# Patient Record
Sex: Male | Born: 1945 | Race: Black or African American | Hispanic: No | Smoking: Current every day smoker
Health system: Southern US, Community
[De-identification: ages and names within clinical notes are randomized; demographics above are authoritative.]

## PROBLEM LIST (undated history)

## (undated) DIAGNOSIS — F17200 Nicotine dependence, unspecified, uncomplicated: Secondary | ICD-10-CM

## (undated) DIAGNOSIS — M1990 Unspecified osteoarthritis, unspecified site: Secondary | ICD-10-CM

## (undated) DIAGNOSIS — I2510 Atherosclerotic heart disease of native coronary artery without angina pectoris: Secondary | ICD-10-CM

## (undated) DIAGNOSIS — R39198 Other difficulties with micturition: Secondary | ICD-10-CM

## (undated) DIAGNOSIS — J4489 Other specified chronic obstructive pulmonary disease: Secondary | ICD-10-CM

## (undated) DIAGNOSIS — R079 Chest pain, unspecified: Secondary | ICD-10-CM

## (undated) DIAGNOSIS — D126 Benign neoplasm of colon, unspecified: Secondary | ICD-10-CM

## (undated) DIAGNOSIS — F1010 Alcohol abuse, uncomplicated: Secondary | ICD-10-CM

## (undated) DIAGNOSIS — J449 Chronic obstructive pulmonary disease, unspecified: Secondary | ICD-10-CM

## (undated) DIAGNOSIS — R011 Cardiac murmur, unspecified: Secondary | ICD-10-CM

## (undated) DIAGNOSIS — G250 Essential tremor: Secondary | ICD-10-CM

## (undated) DIAGNOSIS — J9383 Other pneumothorax: Secondary | ICD-10-CM

## (undated) DIAGNOSIS — N183 Chronic kidney disease, stage 3 (moderate): Secondary | ICD-10-CM

## (undated) DIAGNOSIS — K219 Gastro-esophageal reflux disease without esophagitis: Secondary | ICD-10-CM

## (undated) DIAGNOSIS — I10 Essential (primary) hypertension: Secondary | ICD-10-CM

## (undated) HISTORY — DX: Chest pain, unspecified: R07.9

## (undated) HISTORY — DX: Unspecified osteoarthritis, unspecified site: M19.90

## (undated) HISTORY — DX: Essential (primary) hypertension: I10

## (undated) HISTORY — DX: Essential tremor: G25.0

## (undated) HISTORY — PX: COLONOSCOPY: SHX174

## (undated) HISTORY — DX: Nicotine dependence, unspecified, uncomplicated: F17.200

## (undated) HISTORY — DX: Benign neoplasm of colon, unspecified: D12.6

---

## 2000-04-03 ENCOUNTER — Encounter: Payer: Self-pay | Admitting: Emergency Medicine

## 2000-04-03 ENCOUNTER — Emergency Department (HOSPITAL_COMMUNITY): Admission: EM | Admit: 2000-04-03 | Discharge: 2000-04-03 | Payer: Self-pay | Admitting: Emergency Medicine

## 2003-05-19 DIAGNOSIS — D126 Benign neoplasm of colon, unspecified: Secondary | ICD-10-CM

## 2003-05-19 HISTORY — DX: Benign neoplasm of colon, unspecified: D12.6

## 2004-01-31 ENCOUNTER — Ambulatory Visit (HOSPITAL_COMMUNITY): Admission: RE | Admit: 2004-01-31 | Discharge: 2004-01-31 | Payer: Self-pay | Admitting: *Deleted

## 2004-01-31 ENCOUNTER — Encounter (INDEPENDENT_AMBULATORY_CARE_PROVIDER_SITE_OTHER): Payer: Self-pay | Admitting: *Deleted

## 2009-08-05 ENCOUNTER — Ambulatory Visit (HOSPITAL_COMMUNITY): Admission: RE | Admit: 2009-08-05 | Discharge: 2009-08-05 | Payer: Self-pay | Admitting: Interventional Radiology

## 2009-08-05 ENCOUNTER — Inpatient Hospital Stay (HOSPITAL_COMMUNITY): Admission: EM | Admit: 2009-08-05 | Discharge: 2009-08-08 | Payer: Self-pay | Admitting: Emergency Medicine

## 2009-08-05 DIAGNOSIS — J9383 Other pneumothorax: Secondary | ICD-10-CM

## 2009-08-05 HISTORY — PX: CHEST TUBE INSERTION: SHX231

## 2009-08-05 HISTORY — DX: Other pneumothorax: J93.83

## 2009-08-08 ENCOUNTER — Ambulatory Visit: Payer: Self-pay | Admitting: Cardiothoracic Surgery

## 2009-08-14 ENCOUNTER — Ambulatory Visit: Payer: Self-pay | Admitting: Cardiothoracic Surgery

## 2009-08-22 ENCOUNTER — Ambulatory Visit: Payer: Self-pay | Admitting: Cardiothoracic Surgery

## 2009-08-22 ENCOUNTER — Encounter: Admission: RE | Admit: 2009-08-22 | Discharge: 2009-08-22 | Payer: Self-pay | Admitting: Cardiothoracic Surgery

## 2009-09-13 ENCOUNTER — Ambulatory Visit: Payer: Self-pay | Admitting: Cardiothoracic Surgery

## 2010-08-11 LAB — COMPREHENSIVE METABOLIC PANEL
AST: 17 U/L (ref 0–37)
Calcium: 8.9 mg/dL (ref 8.4–10.5)
Creatinine, Ser: 0.83 mg/dL (ref 0.4–1.5)
GFR calc Af Amer: >60 mL/min (ref >60)
GFR calc non Af Amer: >60 mL/min (ref >60)
Glucose, Bld: 97 mg/dL (ref 70–99)
Sodium: 139 mEq/L (ref 135–145)
Total Bilirubin: 0.4 mg/dL (ref 0.3–1.2)

## 2010-08-11 LAB — PROTIME-INR
INR: 1.06 (ref 0.00–1.49)
Prothrombin Time: 13.7 seconds (ref 11.6–15.2)

## 2010-08-11 LAB — CBC
Hemoglobin: 14.7 g/dL (ref 13.0–17.0)
MCV: 100.3 fL — ABNORMAL HIGH (ref 78.0–100.0)
RBC: 4.37 MIL/uL (ref 4.22–5.81)
WBC: 5.8 10*3/uL (ref 4.0–10.5)

## 2010-08-11 LAB — DIFFERENTIAL
Basophils Relative: 1 % (ref 0–1)
Eosinophils Absolute: 1.1 10*3/uL — ABNORMAL HIGH (ref 0.0–0.7)
Eosinophils Relative: 19 % — ABNORMAL HIGH (ref 0–5)
Monocytes Relative: 10 % (ref 3–12)
Neutrophils Relative %: 36 % — ABNORMAL LOW (ref 43–77)

## 2010-08-11 LAB — APTT: aPTT: 33 seconds (ref 24–37)

## 2010-09-30 NOTE — Assessment & Plan Note (Signed)
OFFICE VISIT   Michael Day, Michael Day  DOB:  [birth year]                                        August 22, 2009  CHART #:  [id number]   The patient presented to the emergency room on August 05, 2009, with left  spontaneous pneumothorax that had probably been present for at least a  week prior according to his symptoms.  A left chest tube was placed with  reexpansion of the lung and the patient was ultimately discharged home  doing well.  At this point he comes to the office today asking if he can  be put on long-term disability which would not think spontaneous left  pneumothorax would result in need for long-term disability.  He also in  spite of counseling while in the hospital continues to smoke some.   On exam his blood pressure 135/90, pulse 85, respiratory rate 18, O2  saturations 97% on room air.  His lungs are clear.  The chest tube sites  healed without infection.  He has no wheezing.  He has no pedal edema or  calf tenderness.   Followup chest x-ray shows clear lung fields with reexpansion of the  lung without evidence of pneumothorax or pneumomediastinum.   It was again reviewed with the patient the need to stop smoking.  He was  referred to his primary care doctor, Dr. Janace Litten for consideration of  nicotine patch and/or other assist in his not smoking endeavor.  He is  now aware of the signs and symptoms of pneumothorax and he knows to call  should he have any recurrent symptoms.   Sheliah Plane, MD  Electronically Signed   EG/MEDQ  D:  08/22/2009  T:  08/23/2009  Job:  [id number]   cc:   Bradd Burner, PA

## 2010-10-03 NOTE — Op Note (Signed)
NAMEMIRANDA, GARBER                           ACCOUNT NO.:  [id number]   MEDICAL RECORD NO.:  [id number]                   PATIENT TYPE:  AMB   LOCATION:  ENDO                                 FACILITY:  [facility]   PHYSICIAN:  Althea Grimmer. Luther Parody, M.D.            DATE OF BIRTH:  1945-09-19   DATE OF PROCEDURE:  01/31/2004  DATE OF DISCHARGE:                                 OPERATIVE REPORT   PROCEDURE:  Video colonoscopy.   INDICATIONS FOR PROCEDURE:  Screening in a 65 year old male without GI  symptoms.   PREPARATION:  N.p.o. since midnight having taken Phospho-Soda prep and a  clear liquid diet, the mucosa is clean throughout up to insertion of the  cecum.   SEDATION:  He received 50 mg Demerol and 7 mg Versed intravenously.  In  addition, he was on nasal cannula O2.   DESCRIPTION OF PROCEDURE:  The Olympus video colonoscope was inserted via  the rectum and advanced through a very tortuous sigmoid colon filled with  diverticula to the splenic flexure.  From this point on, intubation to the  cecum was technically much easier.  Cecal landmarks were identified and  photographed.  In image number one is seen sigmoid diverticulosis.  In image  number two is extensive right sided diverticulosis. In image number three is  seen the cecum with diverticulosis.  On withdrawal, the mucosa was carefully  evaluated.  Diverticula were seen throughout the colon.  In image number  four is a diminutive polyp at 70 cm that was biopsied and removed.  Two  other diminutive polyps were seen at 35 cm and 25 cm and also biopsied.  Retroflex view of the rectum was normal.   IMPRESSION:  1.  Three left sided diminutive polyps biopsied.  2.  Extensive pandiverticulosis.   The patient tolerated the procedure well.  Pulse, blood pressure, and  oximetry testing were stable throughout.  He will be observed in the  recovery room for one hour and discharged if alert.   PLAN:  Polyp histology will be reviewed  and the patient advised of  appropriate follow up.  He is given a brochure on diverticulosis.                                               Althea Grimmer. Luther Parody, M.D.    PJS/MEDQ  D:  01/31/2004  T:  01/31/2004  Job:  [id number]   cc:   Stacie Acres. White, M.D.  [address]  [place]  [place] [address]  Fax: [phone]

## 2011-03-13 ENCOUNTER — Inpatient Hospital Stay (INDEPENDENT_AMBULATORY_CARE_PROVIDER_SITE_OTHER)
Admission: RE | Admit: 2011-03-13 | Discharge: 2011-03-13 | Disposition: A | Discharge status: Home / Self Care | Payer: Self-pay | Source: Ambulatory Visit | Attending: Family Medicine | Admitting: Family Medicine

## 2011-03-13 DIAGNOSIS — I10 Essential (primary) hypertension: Secondary | ICD-10-CM

## 2011-03-13 LAB — POCT I-STAT, CHEM 8
Calcium, Ion: 1.26 mmol/L (ref 1.12–1.32)
Chloride: 108 mEq/L (ref 96–112)
Creatinine, Ser: 1.1 mg/dL (ref 0.50–1.35)
Glucose, Bld: 105 mg/dL — ABNORMAL HIGH (ref 70–99)
Potassium: 4 mEq/L (ref 3.5–5.1)
Sodium: 144 mEq/L (ref 135–145)
TCO2: 24 mmol/L (ref 0–100)

## 2011-06-24 ENCOUNTER — Encounter: Payer: Self-pay | Admitting: Internal Medicine

## 2011-06-24 ENCOUNTER — Ambulatory Visit (INDEPENDENT_AMBULATORY_CARE_PROVIDER_SITE_OTHER): Payer: Self-pay | Admitting: Internal Medicine

## 2011-06-24 DIAGNOSIS — J9383 Other pneumothorax: Secondary | ICD-10-CM | POA: Insufficient documentation

## 2011-06-24 DIAGNOSIS — M1990 Unspecified osteoarthritis, unspecified site: Secondary | ICD-10-CM | POA: Insufficient documentation

## 2011-06-24 DIAGNOSIS — I10 Essential (primary) hypertension: Secondary | ICD-10-CM

## 2011-06-24 DIAGNOSIS — D126 Benign neoplasm of colon, unspecified: Secondary | ICD-10-CM

## 2011-06-24 DIAGNOSIS — Z8709 Personal history of other diseases of the respiratory system: Secondary | ICD-10-CM

## 2011-06-24 DIAGNOSIS — K5730 Diverticulosis of large intestine without perforation or abscess without bleeding: Secondary | ICD-10-CM | POA: Insufficient documentation

## 2011-06-24 NOTE — Progress Notes (Signed)
  Subjective:    Patient ID: Michael Day, male    DOB: 03-29-1946, 66 y.o.   MRN: [id number]  HPI Michael Day is a pleasant 66 year old gentleman with past history of diverticulosis and adenomatous polyps of colon, hypertension and arthritis who comes the clinic for establishment as a new patient.  He was followed with [facility] physicians as his primary care but he does not have insurance and so wants to followup in our clinic and get orange card.  He was seen by Dr. Charlott Rakes for a colonoscopy about 4-5 years before and he needs repeat colonoscopy as per patient.  He says that he takes lisinopril and triamterene for his high blood-pressure- but does remember the dose. We'll request records from his previous PCP.  Overall he denies any fever, chills, nausea vomiting, chest pain, short of breath, vomiting, diarrhea, constipation.  He has history of left pneumothorax with lung collapse in 2011 which required chest tube placement- but is been stable and has had none new episodes after that.   Review of Systems    as per history of present illness, all other systems reviewed and negative. Objective:   Physical Exam General: NAD HEENT: PERRL, EOMI, no scleral icterus Cardiac: S1, S2, RRR, no rubs, murmurs or gallops Pulm: clear to auscultation bilaterally, moving normal volumes of air Abd: soft, nontender, nondistended, BS present Ext: warm and well perfused, no pedal edema Neuro: alert and oriented X3, cranial nerves II-XII grossly intact        Assessment & Plan:

## 2011-06-24 NOTE — Assessment & Plan Note (Signed)
Will refer for colonoscopy today- but could not be set up until he gets orange card. He is going to apply for his orange card today.

## 2011-06-24 NOTE — Patient Instructions (Signed)
Please make a followup appointment in 4-6 weeks after the colonoscopy.  Take all your medications as you take.  If you need any refills or early appointment call the clinic.

## 2011-06-24 NOTE — Assessment & Plan Note (Signed)
Lab Results  Component Value Date   NA 144 03/13/2011   K 4.0 03/13/2011   CL 108 03/13/2011   CO2 24 08/05/2009   BUN 21 03/13/2011   CREATININE 1.10 03/13/2011    BP Readings from Last 3 Encounters:  06/24/11 130/82    Assessment: Hypertension control:  controlled  Progress toward goals:  at goal Barriers to meeting goals:  no barriers identified  Plan: Hypertension treatment:  continue current medications. I will wait for the records from his last PCP- as he does not remember exact dosing of his medications.

## 2011-07-29 ENCOUNTER — Encounter: Payer: Self-pay | Admitting: Internal Medicine

## 2011-07-29 DIAGNOSIS — F17200 Nicotine dependence, unspecified, uncomplicated: Secondary | ICD-10-CM | POA: Insufficient documentation

## 2011-07-29 DIAGNOSIS — G250 Essential tremor: Secondary | ICD-10-CM | POA: Insufficient documentation

## 2011-07-29 DIAGNOSIS — F1010 Alcohol abuse, uncomplicated: Secondary | ICD-10-CM | POA: Insufficient documentation

## 2011-08-19 ENCOUNTER — Encounter (HOSPITAL_COMMUNITY): Payer: Self-pay | Admitting: Gastroenterology

## 2011-08-19 ENCOUNTER — Ambulatory Visit (HOSPITAL_COMMUNITY)
Admission: RE | Admit: 2011-08-19 | Discharge: 2011-08-19 | Disposition: A | Discharge status: Home / Self Care | Payer: Self-pay | Source: Ambulatory Visit | Attending: Gastroenterology | Admitting: Gastroenterology

## 2011-08-19 ENCOUNTER — Encounter (HOSPITAL_COMMUNITY)
Admission: RE | Disposition: A | Discharge status: Home / Self Care | Payer: Self-pay | Source: Ambulatory Visit | Attending: Gastroenterology

## 2011-08-19 DIAGNOSIS — I10 Essential (primary) hypertension: Secondary | ICD-10-CM | POA: Insufficient documentation

## 2011-08-19 DIAGNOSIS — Z860100 Personal history of colon polyps, unspecified: Secondary | ICD-10-CM | POA: Insufficient documentation

## 2011-08-19 DIAGNOSIS — Z79899 Other long term (current) drug therapy: Secondary | ICD-10-CM | POA: Insufficient documentation

## 2011-08-19 DIAGNOSIS — D126 Benign neoplasm of colon, unspecified: Secondary | ICD-10-CM | POA: Insufficient documentation

## 2011-08-19 DIAGNOSIS — K648 Other hemorrhoids: Secondary | ICD-10-CM | POA: Insufficient documentation

## 2011-08-19 DIAGNOSIS — K5730 Diverticulosis of large intestine without perforation or abscess without bleeding: Secondary | ICD-10-CM | POA: Insufficient documentation

## 2011-08-19 HISTORY — PX: COLONOSCOPY: SHX5424

## 2011-08-19 SURGERY — COLONOSCOPY
Anesthesia: Moderate Sedation

## 2011-08-19 MED ORDER — FENTANYL NICU IV SYRINGE 50 MCG/ML
INJECTION | INTRAMUSCULAR | Status: DC | PRN
  Administered 2011-08-19: 25 ug via INTRAVENOUS
  Administered 2011-08-19: 15 ug via INTRAVENOUS
  Administered 2011-08-19: 10 ug via INTRAVENOUS
  Administered 2011-08-19: 25 ug via INTRAVENOUS

## 2011-08-19 MED ORDER — MIDAZOLAM HCL 10 MG/2ML IJ SOLN
INTRAMUSCULAR | Status: AC
  Filled 2011-08-19: qty 2

## 2011-08-19 MED ORDER — MIDAZOLAM HCL 5 MG/5ML IJ SOLN
INTRAMUSCULAR | Status: DC | PRN
  Administered 2011-08-19: 1 mg via INTRAVENOUS
  Administered 2011-08-19 (×3): 2 mg via INTRAVENOUS

## 2011-08-19 MED ORDER — FENTANYL CITRATE 0.05 MG/ML IJ SOLN
INTRAMUSCULAR | Status: AC
  Filled 2011-08-19: qty 2

## 2011-08-19 NOTE — Brief Op Note (Signed)
See endopro note 

## 2011-08-19 NOTE — Discharge Instructions (Signed)
Will call results of polyp when available. Hold aspirin and ibuprofen products for 3 days.

## 2011-08-19 NOTE — Op Note (Signed)
[facility] [address] [place], [place]  [address]  COLONOSCOPY PROCEDURE REPORT  PATIENT:  Michael, Day  MR#:  [id number] BIRTHDATE:  05-17-1946, 65 yrs. old  GENDER:  male ENDOSCOPIST:  Charlott Rakes, MD REF. BY: PROCEDURE DATE:  08/19/2011 PROCEDURE:  Colonoscopy with biopsy ASA CLASS:  Class II INDICATIONS:  history of pre-cancerous (adenomatous) colon polyps  MEDICATIONS:   Fentanyl 75 mcg IV, Versed 7 mg IV  DESCRIPTION OF PROCEDURE:   After the risks benefits and alternatives of the procedure were thoroughly explained, informed consent was obtained.  The Pentax Ped Colon [id number] endoscope was introduced through the anus and advanced to the cecum, which was identified by both the appendix and ileocecal valve, without limitations.  The quality of the prep was good..  The instrument was then slowly withdrawn as the colon was fully examined. <<PROCEDUREIMAGES>>  FINDINGS:  Rectal exam unremarkable.  Pediatric colonoscope inserted into the colon and advanced to the cecum, where the appendiceal orifice and ileocecal valve were identified.  On careful withdrawal of the colonoscope diffuse diverticulosis was noted. A 2 mm semi-sessile polyp was seen in the sigmoid colon that was removed with cold biopsy forceps.    Retroflexion revealed small internal hemorrhoids.  COMPLICATIONS:  None  IMPRESSION:   1. Small colon polyp -s/p removal with cold biopsy 2. Diffuse diverticulosis 3. Small internal hemorrhoids  RECOMMENDATIONS:    1. High fiber diet 2. F/U on path 3. Avoid aspirin products for 3 days  ______________________________ Charlott Rakes, MD  CC:  n. eSIGNEDCharlott Rakes at 08/19/2011 11:02 AM  Asher Muir, [id number]

## 2011-08-19 NOTE — H&P (Signed)
  Date of Initial H&P: 07/28/11  History reviewed, patient examined, no change in status, stable for surgery.

## 2011-08-20 ENCOUNTER — Encounter (HOSPITAL_COMMUNITY): Payer: Self-pay

## 2011-08-20 ENCOUNTER — Encounter (HOSPITAL_COMMUNITY): Payer: Self-pay | Admitting: Gastroenterology

## 2011-08-21 ENCOUNTER — Telehealth: Payer: Self-pay | Admitting: *Deleted

## 2011-08-21 NOTE — Telephone Encounter (Signed)
Pt states he had collapsed lung on left "a couple of years ago" - it is recovered now but "every now and then for the last couple of months, when I move a certain way, like tucking my shirt into my pants on the left side, it feels like something is pressing on my heart and it hurts." - pt denies shortness of breath or any other discomfort - requests cardiology referral - pt advised to come for ov to see physician to determine if need is cardiac or something else. Pt given appt for Monday, April 8 with Dr. Dierdre Searles, the first available appt in the clinic, but instructed to report to ED if chest discomfort, pressure, pain, increases in frequency, intensity, or is accompanied by any breathing difficulty. Pt verbalizes understanding of these instructions, for appt, and for emergent care at ED if needed. Dorie Rank, RN, 08/21/2011, 5:56P

## 2011-08-24 ENCOUNTER — Ambulatory Visit (INDEPENDENT_AMBULATORY_CARE_PROVIDER_SITE_OTHER): Payer: Self-pay | Admitting: Internal Medicine

## 2011-08-24 ENCOUNTER — Encounter: Payer: Self-pay | Admitting: Internal Medicine

## 2011-08-24 VITALS — BP 148/91 | HR 79 | Temp 97.6°F | Ht 69.0 in | Wt 189.9 lb

## 2011-08-24 DIAGNOSIS — N62 Hypertrophy of breast: Secondary | ICD-10-CM | POA: Insufficient documentation

## 2011-08-24 DIAGNOSIS — I10 Essential (primary) hypertension: Secondary | ICD-10-CM

## 2011-08-24 DIAGNOSIS — M940 Chondrocostal junction syndrome [Tietze]: Secondary | ICD-10-CM | POA: Insufficient documentation

## 2011-08-24 MED ORDER — HYDROCHLOROTHIAZIDE 25 MG PO TABS: 25.0000 mg | ORAL_TABLET | Freq: Every day | ORAL | Status: DC

## 2011-08-24 MED ORDER — IBUPROFEN 400 MG PO TABS: 600.0000 mg | ORAL_TABLET | Freq: Four times a day (QID) | ORAL | Status: AC | PRN

## 2011-08-24 NOTE — Patient Instructions (Signed)
1. Take ibuprofen for chest wall pain 2. F/u in 2 weeks about discuss about the smoking and alcohol cessation

## 2011-08-24 NOTE — Progress Notes (Addendum)
Patient ID: Jalen Daluz, male   DOB: 1945-07-25, 66 y.o.   MRN: [id number]  Subjective:   Patient ID: Kaelan Amble male   DOB: 23-Jul-1945 66 y.o.   MRN: [id number]  HPI: Mr. Montminy is a pleasant 66 year old gentleman with PMH of hypertension,  history of left pneumothorax with lung collapse in 2011, alcohol and tobacco abuse who comes the clinic for evaluation of left chest pain and left breast enlargement.  1. Patient states that he has had intermittent left chest wall pain for past 3 months. His left chest wall pain is around his left breast, sharp, lasting seconds, without radiation, and could only be caused by turning his upper body. Patient reports he has had a history of left pneumothorax with lung collapse in 2011 which required chest tube placement- but has been stable and has had none new episodes after that. Patient states that his chest wall pain is located near his healed chest tube site as well. Denies redness, swelling, skin rash. Denies any chest pressure, palpitation or SOB.   2. Patient also noticed his left breast getting bigger for last two weeks. He denies any B symptoms. Denies any nipple discharge.  Patent denies any family history of breast cancer.  No headache, fever, or sore throat. No shortness of breath or dyspnea on exertion. No nausea, vomiting, or abdominal pain. No melena, diarrhea or incontinence. No muscle weakness.Denies depression. No appetite or weight changes.   Of note, patient admits ~40 years history of heavy alcohol and Tobacco abuse.   Past Medical History  Diagnosis Date  . Hypertension   . Arthritis   . Adenomatous polyp of colon 2005    3 polyps removal per colonoscopy  . Tobacco dependence   . Tremor, essential   . Alcohol use    Current Outpatient Prescriptions  Medication Sig Dispense Refill  . fluticasone (FLONASE) 50 MCG/ACT nasal spray Place 2 sprays into the nose daily.      . hydrochlorothiazide (HYDRODIURIL) 25 MG tablet Take 1  tablet (25 mg total) by mouth daily.  30 tablet  0  . lisinopril (PRINIVIL,ZESTRIL) 40 MG tablet Take 40 mg by mouth daily.      Marland Kitchen omeprazole (PRILOSEC) 20 MG capsule Take 20 mg by mouth daily.      Marland Kitchen DISCONTD: hydrochlorothiazide (HYDRODIURIL) 25 MG tablet Take 25 mg by mouth daily.      Marland Kitchen ibuprofen (ADVIL,MOTRIN) 400 MG tablet Take 1.5 tablets (600 mg total) by mouth every 6 (six) hours as needed for pain.  30 tablet  0   Family History  Problem Relation Age of Onset  . Stroke Mother   . Prostate cancer Father    History   Social History  . Marital Status: Divorced    Spouse Name: N/A    Number of Children: N/A  . Years of Education: N/A   Social History Main Topics  . Smoking status: Current Everyday Smoker -- 0.5 packs/day    Types: Cigarettes  . Smokeless tobacco: None  . Alcohol Use: 2.5 - 3.0 oz/week    5-6 drink(s) per week     couple shots/ day  . Drug Use: No  . Sexually Active: None   Other Topics Concern  . None   Social History Narrative   lives in [place].   Review of Systems: See HPI Objective:  Physical Exam: Filed Vitals:   08/24/11 1403  BP: 148/91  Pulse: 79  Temp: 97.6 F (36.4 C)  TempSrc: Oral  Height:  5\' 9"  (1.753 m)  Weight: 189 lb 14.4 oz (86.138 kg)  SpO2: 100%   General: NAD HEENT: PERRL, EOMI, no scleral icterus Cardiac: S1, S2, RRR, no rubs, murmurs or gallops Pulm: clear to auscultation bilaterally, moving normal volumes of air B/L breasts enlargement noted with Left breast is bigger than right. Left lateral chest wall tenderness noted. No crepitus.  No LAN, No nipple discharge. No erythema, swelling, or skin changes noted on breasts. No Bruising noted.   Abd: soft, nontender, nondistended, BS present Ext: warm and well perfused, no pedal edema Neuro: alert and oriented X3, cranial nerves II-XII grossly intact  Assessment & Plan:

## 2011-08-24 NOTE — Assessment & Plan Note (Signed)
Patient presents with B/L breast enlargement in the setting of long Hx of heavy alcohol abuse. He reports recent enlargement right side more than left side.  No LAN, nipple discharge or skin change. Patient refused mammogram for now.  - will follow up in 2 weeks - consider mammogram

## 2011-08-24 NOTE — Assessment & Plan Note (Signed)
Patient is noted to have mild elevated BP during this visit. He states that he does not take his Maxzide due to financial strain  - will stop his Maxzide  - add HCTZ

## 2011-08-25 LAB — BASIC METABOLIC PANEL WITH GFR
BUN: 26 mg/dL — ABNORMAL HIGH (ref 6–23)
CO2: 24 mEq/L (ref 19–32)
Chloride: 104 mEq/L (ref 96–112)
Creat: 1.35 mg/dL (ref 0.50–1.35)
GFR, Est Non African American: 55 mL/min — ABNORMAL LOW
Potassium: 4.2 mEq/L (ref 3.5–5.3)

## 2011-09-11 ENCOUNTER — Encounter: Payer: Self-pay | Admitting: Internal Medicine

## 2011-09-11 ENCOUNTER — Ambulatory Visit (INDEPENDENT_AMBULATORY_CARE_PROVIDER_SITE_OTHER): Payer: Self-pay | Admitting: Internal Medicine

## 2011-09-11 VITALS — BP 138/89 | HR 78 | Temp 97.4°F | Ht 69.0 in | Wt 188.8 lb

## 2011-09-11 DIAGNOSIS — G250 Essential tremor: Secondary | ICD-10-CM

## 2011-09-11 DIAGNOSIS — G252 Other specified forms of tremor: Secondary | ICD-10-CM

## 2011-09-11 DIAGNOSIS — I10 Essential (primary) hypertension: Secondary | ICD-10-CM

## 2011-09-11 DIAGNOSIS — D126 Benign neoplasm of colon, unspecified: Secondary | ICD-10-CM

## 2011-09-11 MED ORDER — PROPRANOLOL HCL 40 MG PO TABS: 40.0000 mg | ORAL_TABLET | Freq: Two times a day (BID) | ORAL | Status: DC

## 2011-09-11 NOTE — Assessment & Plan Note (Signed)
Lab Results  Component Value Date   NA 140 08/24/2011   K 4.2 08/24/2011   CL 104 08/24/2011   CO2 24 08/24/2011   BUN 26* 08/24/2011   CREATININE 1.35 08/24/2011   CREATININE 1.10 03/13/2011    BP Readings from Last 3 Encounters:  09/11/11 138/89  08/24/11 148/91  08/19/11 135/86    Assessment: Hypertension control:  controlled  Progress toward goals:  at goal Barriers to meeting goals:  no barriers identified  Plan: Hypertension treatment:  continue current medications

## 2011-09-11 NOTE — Patient Instructions (Signed)
Please make followup appointment in 4-5 months. Make an early appointment meanwhile if you still having chest pain (as you had during last visit ) or any other problems.  Start taking propranolol 40 mg twice a day for your hand shaking. It should take the edge off. If the shaking gets worse give [place] a call to make an early appointment.  Take all medications regularly.

## 2011-09-11 NOTE — Progress Notes (Signed)
  Subjective:    Patient ID: Michael Day, male    DOB: 15-Dec-1945, 66 y.o.   MRN: [id number]  HPI [name] is a pleasant 66 year old man with past history significant for adenomatous polyp in colon, hypertension, alcohol use, essential tremor who comes the clinic for followup visit. He was seen by Dr. Dierdre Searles on 08/24/2011 for left chest muscular skeletal pain- which is better now. He only has pain if he strains his chest muscles more. No pain at rest. No tenderness present.  He does complain of essential tremor which he has for many years now. It's getting worse now and he has trouble writing, holding a cup or any intentional work lately. He is never been treated for this before. He denies any sensation changes in hands or lower extremities. Has no focal weakness or numbness. Has no change in gait or trouble walking. Says that alcohol takes off the edge once in a while. Nothing else makes it better. Intentional activity makes it worse. Has no change in vision, headache, chest pain, short of breath, fever, nausea vomiting.   Review of Systems    as per history of present illness, all other systems reviewed and negative. Objective:   Physical Exam General: NAD HEENT: PERRL, EOMI, no scleral icterus Cardiac: RRR, no rubs, murmurs or gallops Pulm: clear to auscultation bilaterally, moving normal volumes of air Abd: soft, nontender, nondistended, BS present Ext: warm and well perfused, no pedal edema Neuro: alert and oriented X3, cranial nerves II-XII grossly intact, intentional tremor present, and handwriting haphazard       Assessment & Plan:

## 2011-09-11 NOTE — Assessment & Plan Note (Signed)
Recent colonoscopy per Dr. Charlott Rakes, shows extensive diverticulosis and remote one small adenomatous polyp. Repeat colonoscopy in 5 years.

## 2011-09-11 NOTE — Assessment & Plan Note (Signed)
Patient's essential tremor has worsened lately.  I will try to control symptomatically- bearing with propranolol 40 mg twice a day- as first line agent and see how he responds. I will see him back in 3-4 months and reassess.

## 2011-10-06 ENCOUNTER — Other Ambulatory Visit: Payer: Self-pay | Admitting: *Deleted

## 2011-10-06 MED ORDER — HYDROCHLOROTHIAZIDE 25 MG PO TABS: 25.0000 mg | ORAL_TABLET | Freq: Every day | ORAL | Status: DC

## 2011-11-16 ENCOUNTER — Other Ambulatory Visit: Payer: Self-pay | Admitting: *Deleted

## 2011-11-17 ENCOUNTER — Telehealth: Payer: Self-pay | Admitting: *Deleted

## 2011-11-17 MED ORDER — HYDROCHLOROTHIAZIDE 25 MG PO TABS: 25.0000 mg | ORAL_TABLET | Freq: Every day | ORAL | Status: DC

## 2011-11-17 NOTE — Telephone Encounter (Signed)
Pt called stating a contractor is coming out to take his door out because it contains lead.   Pt feels fine but wanted to know if he needs to be examined or have his blood checked. Pt # [id number]

## 2011-11-18 NOTE — Telephone Encounter (Signed)
Unclear without evaluating patient. Might need lab tests including CBC and lead levels with an office visit, if there is a concern of Lead exposure( acute or chronic). Please call patient and ask if she can come for evaluation. Thanks.  Baruch Lewers.

## 2011-11-27 NOTE — Telephone Encounter (Signed)
Pt scheduled for 7/17th at 3:45

## 2011-12-02 ENCOUNTER — Encounter: Payer: Self-pay | Admitting: Internal Medicine

## 2011-12-02 ENCOUNTER — Ambulatory Visit (INDEPENDENT_AMBULATORY_CARE_PROVIDER_SITE_OTHER): Payer: Self-pay | Admitting: Internal Medicine

## 2011-12-02 VITALS — BP 122/76 | HR 72 | Temp 97.4°F | Ht 69.0 in | Wt 187.3 lb

## 2011-12-02 DIAGNOSIS — G250 Essential tremor: Secondary | ICD-10-CM

## 2011-12-02 DIAGNOSIS — F17200 Nicotine dependence, unspecified, uncomplicated: Secondary | ICD-10-CM

## 2011-12-02 DIAGNOSIS — I10 Essential (primary) hypertension: Secondary | ICD-10-CM

## 2011-12-02 NOTE — Progress Notes (Signed)
  Subjective:    Patient ID: Michael Day, male    DOB: 01/04/1946, 66 y.o.   MRN: [id number]  HPI Michael Day Is a pleasant 66 year old man with past history of essential tremor, costochondritis, hypertension, osteoarthritis, colon polyp who comes to the clinic for followup visit. His house was checked recently and was found Lead on the back door and garage and he was concerned about lead poisoning. He does not have any anemia, chronic abdominal pain, lead line, New neuropsychiatric changes. Discussed with him in details about above things. He has chronic essential tremors- familial, which is unchanged for past many years. He denies any fever, chills, nausea, vomiting, chest pain, short of breath. He does have intermittent chest muscle pain and back muscle pain when he works out more. Otherwise he feels at baseline.  - He does complain of morning cough and nighttime coughing- which gets better after sputum. He is a chronic smoker. About 50-pack-year history. Discussed about quitting smoking in detail. Hasn't tried it before. [place] Spaniel he would think about it.    Review of Systems    As per history of present illness, all other systems reviewed and negative. Objective:   Physical Exam  General: NAD HEENT: PERRL, EOMI, no scleral icterus, no lead line. Cardiac: RRR, no rubs, murmurs or gallops Pulm: clear to auscultation bilaterally, moving normal volumes of air Abd: soft, nontender, nondistended, BS present Ext: warm and well perfused, no pedal edema Neuro: alert and oriented X3, cranial nerves II-XII grossly intact       Assessment & Plan:

## 2011-12-02 NOTE — Patient Instructions (Signed)
Please make followup appointment in 5-6 months. Meanwhile keep taking all medications regularly. If anything comes up, gives a call for early appointment. You don't seem to have any signs or symptoms of lead poisoning. Don't think any need for blood test at present.

## 2011-12-02 NOTE — Assessment & Plan Note (Signed)
Counseled for smoking cessation in detail. Patient does not seem ready to quit at present. But was inquiring about his options for nicotine patches and gums. Patches will be limited due to insurance issue. Advised him to try nicotine gums if he can afford. Talk with him about deciding a quit date- and then let me know for further help. We can also try bupropion or Chantix in future as needed.

## 2011-12-02 NOTE — Assessment & Plan Note (Signed)
Blood pressure at goal. Continue lisinopril, HCTZ and propranolol. Propranolol is also for essential tremors.

## 2011-12-02 NOTE — Assessment & Plan Note (Signed)
Some improvement with propranolol. Continue for now.

## 2012-02-11 ENCOUNTER — Other Ambulatory Visit: Payer: Self-pay | Admitting: *Deleted

## 2012-02-11 DIAGNOSIS — G250 Essential tremor: Secondary | ICD-10-CM

## 2012-02-11 MED ORDER — PROPRANOLOL HCL 40 MG PO TABS: 40.0000 mg | ORAL_TABLET | Freq: Two times a day (BID) | ORAL | Status: DC

## 2012-02-11 NOTE — Telephone Encounter (Signed)
Needs appt Jan with Dr Allena Katz routine F/U in Cont clinic

## 2012-04-25 ENCOUNTER — Telehealth: Payer: Self-pay | Admitting: *Deleted

## 2012-04-25 NOTE — Telephone Encounter (Signed)
Pt called in the past week - has had 2 occ irreg heartbeat. Last one was 04/24/12. Appt made 04/27/12 9:45AM Dr Bosie Clos. Stanton Kidney Maven Varelas RN 04/25/12 2PM

## 2012-04-27 ENCOUNTER — Ambulatory Visit (INDEPENDENT_AMBULATORY_CARE_PROVIDER_SITE_OTHER): Payer: PRIVATE HEALTH INSURANCE | Admitting: Internal Medicine

## 2012-04-27 ENCOUNTER — Encounter: Payer: Self-pay | Admitting: Internal Medicine

## 2012-04-27 ENCOUNTER — Ambulatory Visit (HOSPITAL_COMMUNITY)
Admission: RE | Admit: 2012-04-27 | Discharge: 2012-04-27 | Disposition: A | Discharge status: Home / Self Care | Payer: PRIVATE HEALTH INSURANCE | Source: Ambulatory Visit | Attending: Internal Medicine | Admitting: Internal Medicine

## 2012-04-27 VITALS — BP 155/87 | HR 62 | Temp 97.6°F | Ht 68.5 in | Wt 195.0 lb

## 2012-04-27 DIAGNOSIS — R002 Palpitations: Secondary | ICD-10-CM | POA: Insufficient documentation

## 2012-04-27 DIAGNOSIS — F17200 Nicotine dependence, unspecified, uncomplicated: Secondary | ICD-10-CM

## 2012-04-27 DIAGNOSIS — I459 Conduction disorder, unspecified: Secondary | ICD-10-CM | POA: Insufficient documentation

## 2012-04-27 DIAGNOSIS — I498 Other specified cardiac arrhythmias: Secondary | ICD-10-CM | POA: Insufficient documentation

## 2012-04-27 DIAGNOSIS — I10 Essential (primary) hypertension: Secondary | ICD-10-CM

## 2012-04-27 LAB — TROPONIN I: Troponin I: <0.3 ng/mL (ref <0.30)

## 2012-04-27 LAB — CBC
MCHC: 33.9 g/dL (ref 30.0–36.0)
RDW: 14 % (ref 11.5–15.5)

## 2012-04-27 LAB — TSH: TSH: 1.481 u[IU]/mL (ref 0.350–4.500)

## 2012-04-27 NOTE — Progress Notes (Signed)
  Subjective:    Patient ID: Michael Day, male    DOB: 07-Nov-1945, 66 y.o.   MRN: [id number]  HPI  Pt with hx significant for htn, tobacco abuse and prior spontaneous pneumothorax 2 years ago with subsequent left chest tube placement who presents for evaluation of "irregular heart beats".  Pt states that a couple of days ago he felt an "irregular heart beat like before" that lasted less than a few minutes and the sensation that "something grabbed my heart".  Pt states that he was "checked out" for this previously.  Of note, there is no documentation evident in EPIC for palpitations or arrhythmia.  He has been assessed for left chest pain after his chest tube placement.  Only previous EKG is from 07/2009 when he was admitted for spontaneous pneumothorax.  He denies chest pain, diaphoresis, nausea, light-headness, weakness or shortness of breath. He continues to smoke ~1ppd of cigarettes.   Review of Systems As per HPI otherwise negative    Objective:   Physical Exam  Constitutional: He is oriented to person, place, and time. He appears well-developed and well-nourished. No distress.  HENT:  Head: Normocephalic and atraumatic.  Eyes: Conjunctivae normal and EOM are normal. Pupils are equal, round, and reactive to light.  Neck: Normal range of motion. Neck supple. No thyromegaly present.  Cardiovascular: Normal rate, regular rhythm, normal heart sounds and intact distal pulses.   No murmur heard. Pulmonary/Chest: Effort normal and breath sounds normal. No respiratory distress. He has no wheezes. He has no rales. He exhibits no tenderness.       +gynecomastia   Abdominal: Soft. Bowel sounds are normal.  Musculoskeletal: Normal range of motion. He exhibits no edema and no tenderness.  Neurological: He is alert and oriented to person, place, and time.  Skin: Skin is warm and dry.  Psychiatric: He has a normal mood and affect. His behavior is normal. Judgment and thought content normal.           Assessment & Plan:  1. Skipped heart beat: per pt complaints, no evidence on EKG today, pt w/o s/s of ischemia or tachyarrhythmia, EKG with bradycardia 56 bpm, no PVCs/PACs, will refer to Cardiology for further evaluation with possible monitoring -appt w/ Dr. Eden Emms of [facility] 05/25/2012 -check CBC r/o anemia -check TSH for hypothyroidism  2. Htn: at goal on ACEi and thiazide, 140/80 -cont lisinopril 40 mg and HCTZ 25 mg qd  3. Tobacco abuse: cont 1ppd, counseled -given 1800-QUIT-NOW resource

## 2012-04-27 NOTE — Assessment & Plan Note (Signed)
BP Readings from Last 3 Encounters:  04/27/12 155/87  12/02/11 122/76  09/11/11 138/89    Lab Results  Component Value Date   NA 140 08/24/2011   K 4.2 08/24/2011   CREATININE 1.35 08/24/2011    Assessment:  Blood pressure control: controlled  Progress toward BP goal:  at goal  Comments:   Plan:  Medications:  continue current medications  Educational resources provided: brochure  Self management tools provided: home blood pressure logbook  Other plans:

## 2012-04-27 NOTE — Assessment & Plan Note (Signed)
  Assessment:  Progress toward smoking cessation:  smoking the same amount  Barriers to progress toward smoking cessation:  lack of motivation to quit  Comments:   Plan:  Instruction/counseling given:  I counseled patient on the dangers of tobacco use and advised patient to stop smoking.  Educational resources provided:  other (see comments) (given brochures)  Self management tools provided:     Medications to assist with smoking cessation:  Declined Patient agreed to the following self-care plans for smoking cessation:  set a quit date and stop smoking   Other:

## 2012-04-27 NOTE — Patient Instructions (Addendum)
The EKG does not show an abnormal heart beat today. We will refer you to a Cardiologist for further evaluation.  General Instructions:    Treatment Goals:  Goals (1 Years of Data) as of 04/27/2012    None      Progress Toward Treatment Goals:  Treatment Goal 04/27/2012  Blood pressure at goal  Stop smoking smoking the same amount    Self Care Goals & Plans:  Self Care Goal 04/27/2012  Manage my medications bring my medications to every visit  Eat healthy foods (No Data)  Be physically active find an activity I enjoy  Stop smoking set a quit date and stop smoking       Care Management & Community Referrals:   Keep appt with Dr. Eden Emms of Cardiology 05/25/2012

## 2012-05-17 ENCOUNTER — Other Ambulatory Visit: Payer: Self-pay | Admitting: Internal Medicine

## 2012-05-19 NOTE — Telephone Encounter (Signed)
Needs April appt with PCP

## 2012-05-20 ENCOUNTER — Other Ambulatory Visit: Payer: Self-pay | Admitting: *Deleted

## 2012-05-21 ENCOUNTER — Telehealth: Payer: Self-pay | Admitting: Internal Medicine

## 2012-05-21 MED ORDER — HYDROCHLOROTHIAZIDE 25 MG PO TABS: 25.0000 mg | ORAL_TABLET | Freq: Every day | ORAL | Status: DC

## 2012-05-21 NOTE — Telephone Encounter (Signed)
Patient states he is out of fluid blood pressure medication and confirms name and dose and pharmacy from bottle. He states he called and tried to get Dr. Allena Katz to refill it however is out now and needs it. Will refill for one month and then allow PCP to refill.   Dr. Dorise Hiss 12:09 PM 05/21/2012

## 2012-05-23 ENCOUNTER — Other Ambulatory Visit: Payer: Self-pay | Admitting: *Deleted

## 2012-05-23 MED ORDER — HYDROCHLOROTHIAZIDE 25 MG PO TABS: 25.0000 mg | ORAL_TABLET | Freq: Every day | ORAL | Status: DC

## 2012-05-23 NOTE — Telephone Encounter (Signed)
Med refill- already completed.

## 2012-05-25 ENCOUNTER — Ambulatory Visit: Payer: PRIVATE HEALTH INSURANCE | Admitting: Cardiovascular Disease

## 2012-06-02 ENCOUNTER — Ambulatory Visit (INDEPENDENT_AMBULATORY_CARE_PROVIDER_SITE_OTHER): Payer: PRIVATE HEALTH INSURANCE | Admitting: Cardiovascular Disease

## 2012-06-02 ENCOUNTER — Encounter: Payer: Self-pay | Admitting: Cardiovascular Disease

## 2012-06-02 VITALS — BP 140/90 | HR 60 | Wt 191.0 lb

## 2012-06-02 DIAGNOSIS — R079 Chest pain, unspecified: Secondary | ICD-10-CM

## 2012-06-02 DIAGNOSIS — I459 Conduction disorder, unspecified: Secondary | ICD-10-CM

## 2012-06-02 DIAGNOSIS — R002 Palpitations: Secondary | ICD-10-CM

## 2012-06-02 DIAGNOSIS — F17200 Nicotine dependence, unspecified, uncomplicated: Secondary | ICD-10-CM

## 2012-06-02 DIAGNOSIS — I499 Cardiac arrhythmia, unspecified: Secondary | ICD-10-CM

## 2012-06-02 DIAGNOSIS — R0600 Dyspnea, unspecified: Secondary | ICD-10-CM

## 2012-06-02 DIAGNOSIS — R0989 Other specified symptoms and signs involving the circulatory and respiratory systems: Secondary | ICD-10-CM

## 2012-06-02 NOTE — Patient Instructions (Addendum)
**Note De-Identified Debbie Bellucci Obfuscation** Your physician has requested that you have an echocardiogram. Echocardiography is a painless test that uses sound waves to create images of your heart. It provides your doctor with information about the size and shape of your heart and how well your heart's chambers and valves are working. This procedure takes approximately one hour. There are no restrictions for this procedure.  Your physician has requested that you have an exercise tolerance test. For further information please visit [email or web address] Please also follow instruction sheet, as given.  Your physician recommends that you schedule a follow-up appointment in: as needed

## 2012-06-02 NOTE — Progress Notes (Signed)
Patient ID: Michael Day, male   DOB: [birth year], 67 y.o.   MRN: [id number] Pt with hx significant for htn, tobacco abuse and prior spontaneous pneumothorax 2 years ago with subsequent left chest tube placement who presents for evaluation of "irregular heart beats". Pt states that a couple of days ago he felt an "irregular heart beat like before" that lasted less than a few minutes and the sensation that "something grabbed my heart". Pt states that he was "checked out" for this previously. Of note, there is no documentation evident in EPIC for palpitations or arrhythmia. He has been assessed for left chest pain after his chest tube placement. Only previous EKG is from 07/2009 when he was admitted for spontaneous pneumothorax. He denies chest pain, diaphoresis, nausea, light-headness, weakness or shortness of breath. He continues to smoke ~1ppd of cigarettes. He gets intermitant atypical chest pain.  Positional Feels like something is grabbing his heart.    ROS: Denies fever, malais, weight loss, blurry vision, decreased visual acuity, cough, sputum, SOB, hemoptysis, pleuritic pain, palpitaitons, heartburn, abdominal pain, melena, lower extremity edema, claudication, or rash.  All other systems reviewed and negative   General: Affect appropriate Healthy:  appears stated age HEENT: normal Neck supple with no adenopathy JVP normal no bruits no thyromegaly Lungs clear with no wheezing and good diaphragmatic motion Heart:  S1/S2 no murmur,rub, gallop or click PMI normal Abdomen: benighn, BS positve, no tenderness, no AAA no bruit.  No HSM or HJR Distal pulses intact with no bruits No edema Neuro non-focal Skin warm and dry No muscular weakness  Medications Current Outpatient Prescriptions  Medication Sig Dispense Refill  . fluticasone (FLONASE) 50 MCG/ACT nasal spray Place 2 sprays into the nose daily.      . hydrochlorothiazide (HYDRODIURIL) 25 MG tablet Take 1 tablet (25 mg total) by mouth  daily.  30 tablet  4  . lisinopril (PRINIVIL,ZESTRIL) 40 MG tablet Take 40 mg by mouth daily.      Marland Kitchen omeprazole (PRILOSEC) 20 MG capsule Take 20 mg by mouth daily.      . propranolol (INDERAL) 40 MG tablet TAKE ONE TABLET BY MOUTH TWICE DAILY  60 tablet  3    Allergies Review of patient's allergies indicates no known allergies.  Family History: Family History  Problem Relation Age of Onset  . Stroke Mother   . Prostate cancer Father     Social History: History   Social History  . Marital Status: Divorced    Spouse Name: N/A    Number of Children: N/A  . Years of Education: N/A   Occupational History  . Not on file.   Social History Main Topics  . Smoking status: Current Every Day Smoker -- 0.5 packs/day    Types: Cigarettes  . Smokeless tobacco: Not on file  . Alcohol Use: 2.5 - 3.0 oz/week    5-6 drink(s) per week     Comment: couple shots/ day  . Drug Use: No  . Sexually Active: Not on file   Other Topics Concern  . Not on file   Social History Narrative   lives in [place].    Electrocardiogram:  SR rate 58 normal 04/27/12  Assessment and Plan

## 2012-06-02 NOTE — Assessment & Plan Note (Signed)
Encouraged him to use nicorette gum Relationship of palpitations to smoking also discussed

## 2012-06-02 NOTE — Assessment & Plan Note (Signed)
Well controlled.  Continue current medications and low sodium Dash type diet.    

## 2012-06-02 NOTE — Assessment & Plan Note (Signed)
Dont last long enough to make event monitor useful  Continue beta blocker which helps BP and tremors as well.  Echo to R/O structural heart disease

## 2012-06-02 NOTE — Assessment & Plan Note (Signed)
Atypical normal ECG  F/U ETT  

## 2012-06-07 ENCOUNTER — Ambulatory Visit (HOSPITAL_COMMUNITY): Payer: PRIVATE HEALTH INSURANCE | Attending: Cardiology | Admitting: Radiology

## 2012-06-07 DIAGNOSIS — R0600 Dyspnea, unspecified: Secondary | ICD-10-CM

## 2012-06-07 DIAGNOSIS — R0609 Other forms of dyspnea: Secondary | ICD-10-CM | POA: Insufficient documentation

## 2012-06-07 DIAGNOSIS — R002 Palpitations: Secondary | ICD-10-CM

## 2012-06-07 DIAGNOSIS — R0989 Other specified symptoms and signs involving the circulatory and respiratory systems: Secondary | ICD-10-CM

## 2012-06-07 DIAGNOSIS — I10 Essential (primary) hypertension: Secondary | ICD-10-CM | POA: Insufficient documentation

## 2012-06-07 DIAGNOSIS — R072 Precordial pain: Secondary | ICD-10-CM

## 2012-06-07 DIAGNOSIS — F17200 Nicotine dependence, unspecified, uncomplicated: Secondary | ICD-10-CM | POA: Insufficient documentation

## 2012-06-07 NOTE — Progress Notes (Signed)
Echocardiogram performed.  

## 2012-06-10 ENCOUNTER — Telehealth: Payer: Self-pay | Admitting: Cardiovascular Disease

## 2012-06-10 NOTE — Telephone Encounter (Signed)
Pt would like echo results 

## 2012-06-10 NOTE — Telephone Encounter (Signed)
LMTCB ./CY 

## 2012-06-10 NOTE — Telephone Encounter (Signed)
PT AWARE OF ECHO RESULTS./CY 

## 2012-06-21 ENCOUNTER — Encounter: Payer: Self-pay | Admitting: Nurse Practitioner

## 2012-06-21 ENCOUNTER — Ambulatory Visit (INDEPENDENT_AMBULATORY_CARE_PROVIDER_SITE_OTHER): Payer: PRIVATE HEALTH INSURANCE | Admitting: Nurse Practitioner

## 2012-06-21 VITALS — BP 154/96 | HR 64

## 2012-06-21 DIAGNOSIS — R0600 Dyspnea, unspecified: Secondary | ICD-10-CM

## 2012-06-21 DIAGNOSIS — R079 Chest pain, unspecified: Secondary | ICD-10-CM

## 2012-06-21 DIAGNOSIS — R002 Palpitations: Secondary | ICD-10-CM

## 2012-06-21 NOTE — Progress Notes (Addendum)
Exercise Treadmill Test  Pre-Exercise Testing Evaluation Rhythm: normal sinus  Rate: 54                 Test  Exercise Tolerance Test Ordering MD: Charlton Haws, MD  Interpreting MD: Norma Fredrickson, NP  Unique Test No: 1  Treadmill:  1  Indication for ETT: chest pain - rule out ischemia  Contraindication to ETT: No   Stress Modality: exercise - treadmill  Cardiac Imaging Performed: non   Protocol: standard Bruce - maximal  Max BP:  210/119  Max MPHR (bpm):  156 85% MPR (bpm):  133  MPHR obtained (bpm): 115 % MPHR obtained:  75%  Reached 85% MPHR (min:sec): N/A Total Exercise Time (min-sec):  2:46  Workload in METS:  4.6 Borg Scale: 17  Reason ETT Terminated:  patient's desire to stop    ST Segment Analysis At Rest: normal ST segments - no evidence of significant ST depression With Exercise: no evidence of significant ST depression  Other Information Arrhythmia:  No Angina during ETT:  absent (0) Quality of ETT:  non-diagnostic  ETT Interpretation:  Non diagnostic GXT  Comments: Patient presents today for routine GXT. Has multiple CV risk factors which include gender, HTN, and ongoing tobacco abuse. Has had chest pain, shortness of breath and palpitations.   Today he exercised on the standard Bruce protocol. He exercised for only 2:46. He did not hold any medicines today. He has quite poor and reduced exercise tolerance. The test was stopped due to fatigue and his complaint of dizziness due to the window in the exam room. No chest pain. He has a strong fear of heights. Blood pressure response was hypertensive. EKG showed no ischemia but overall the test is not diagnostic.   Recommendations: In light of his poor exercise tolerance and multiple CV risk factors will arrange for Lexiscan to further define.   Patient is agreeable to this plan and will call if any problems develop in the interim.

## 2012-06-21 NOTE — Addendum Note (Signed)
Addended by: Rosalio Macadamia on: 06/21/2012 12:19 PM   Modules accepted: Orders

## 2012-06-21 NOTE — Addendum Note (Signed)
Addended by: Leotis Pain on: 06/21/2012 12:08 PM   Modules accepted: Level of Service

## 2012-06-24 ENCOUNTER — Ambulatory Visit: Payer: PRIVATE HEALTH INSURANCE

## 2012-06-28 ENCOUNTER — Ambulatory Visit (HOSPITAL_COMMUNITY): Payer: PRIVATE HEALTH INSURANCE | Attending: Cardiology | Admitting: Radiology

## 2012-06-28 VITALS — BP 128/81 | Ht 69.0 in | Wt 189.0 lb

## 2012-06-28 DIAGNOSIS — R109 Unspecified abdominal pain: Secondary | ICD-10-CM | POA: Insufficient documentation

## 2012-06-28 DIAGNOSIS — R42 Dizziness and giddiness: Secondary | ICD-10-CM | POA: Insufficient documentation

## 2012-06-28 DIAGNOSIS — R079 Chest pain, unspecified: Secondary | ICD-10-CM

## 2012-06-28 DIAGNOSIS — R0989 Other specified symptoms and signs involving the circulatory and respiratory systems: Secondary | ICD-10-CM | POA: Insufficient documentation

## 2012-06-28 DIAGNOSIS — R51 Headache: Secondary | ICD-10-CM | POA: Insufficient documentation

## 2012-06-28 DIAGNOSIS — R0600 Dyspnea, unspecified: Secondary | ICD-10-CM

## 2012-06-28 DIAGNOSIS — R0602 Shortness of breath: Secondary | ICD-10-CM | POA: Insufficient documentation

## 2012-06-28 DIAGNOSIS — R0789 Other chest pain: Secondary | ICD-10-CM | POA: Insufficient documentation

## 2012-06-28 DIAGNOSIS — R002 Palpitations: Secondary | ICD-10-CM

## 2012-06-28 DIAGNOSIS — R0609 Other forms of dyspnea: Secondary | ICD-10-CM | POA: Insufficient documentation

## 2012-06-28 MED ORDER — TECHNETIUM TC 99M SESTAMIBI GENERIC - CARDIOLITE
30.0000 | Freq: Once | INTRAVENOUS | Status: AC | PRN
  Administered 2012-06-28: 30 via INTRAVENOUS

## 2012-06-28 MED ORDER — TECHNETIUM TC 99M SESTAMIBI GENERIC - CARDIOLITE
10.0000 | Freq: Once | INTRAVENOUS | Status: AC | PRN
  Administered 2012-06-28: 10 via INTRAVENOUS

## 2012-06-28 MED ORDER — REGADENOSON 0.4 MG/5ML IV SOLN
0.4000 mg | Freq: Once | INTRAVENOUS | Status: AC
  Administered 2012-06-28: 0.4 mg via INTRAVENOUS

## 2012-06-28 NOTE — Progress Notes (Signed)
[facility] SITE 3 NUCLEAR MED [address] [place], [place] [address] [phone]    Cardiology Nuclear Med Study  Michael Day is a 67 y.o. male     MRN : [id number]     DOB: [birth year]  Procedure Date: 06/28/2012  Nuclear Med Background Indication for Stress Test:  Evaluation for Ischemia, and 06-21-12 GXT: Non-Diagnostic due to patient unable to reach target heartrate History:  01/14 ECHO: EF: 55-60%, 06/21/10: Non Diag Unable to reach [facility] Cardiac Risk Factors: Hypertension and Smoker  Symptoms:  Chest Pain, Dizziness, DOE, Palpitations and SOB   Nuclear Pre-Procedure Caffeine/Decaff Intake:  None > 12 hrs NPO After: 8:00pm   Lungs:  clear O2 Sat: 95% on room air. IV 0.9% NS with Angio Cath:  22g  IV Site: R Antecubital x 1, tolerated well IV Started by:  Irean Hong, RN  Chest Size (in):  44 Cup Size: n/a  Height: 5\' 9"  (1.753 m)  Weight:  189 lb (85.73 kg)  BMI:  Body mass index is 27.9 kg/(m^2). Tech Comments:  Took Inderal this am    Nuclear Med Study 1 or 2 day study: 1 day  Stress Test Type:  Lexiscan  Reading MD: Cassell Clement, MD  Order Authorizing Provider:  Charlton Haws, MD  Resting Radionuclide: Technetium 73m Sestamibi  Resting Radionuclide Dose: 11.0 mCi   Stress Radionuclide:  Technetium 37m Sestamibi  Stress Radionuclide Dose: 33.0 mCi           Stress Protocol Rest HR: 68 Stress HR: 104  Rest BP: 128/81 Stress BP: 142/87  Exercise Time (min): n/a METS: n/a   Predicted Max HR: 156 bpm % Max HR: 66.67 bpm Rate Pressure Product: 78469   Dose of Adenosine (mg):  n/a Dose of Lexiscan: 0.4 mg  Dose of Atropine (mg): n/a Dose of Dobutamine: n/a mcg/kg/min (at max HR)  Stress Test Technologist: Milana Na, EMT-P  Nuclear Technologist:  Domenic Polite, CNMT     Rest Procedure:  Myocardial perfusion imaging was performed at rest 45 minutes following the intravenous administration of Technetium 63m Sestamibi. Rest ECG: NSR -  Normal EKG  Stress Procedure:  The patient received IV Lexiscan 0.4 mg over 15-seconds.  Technetium 60m Sestamibi injected at 30-seconds.  This patient had sob, headache, and abdominal pain with Lexiscan. Quantitative spect images were obtained after a 45 minute delay. Stress ECG: No significant change from baseline ECG  QPS Raw Data Images:  Normal; no motion artifact; normal heart/lung ratio. Stress Images:  Normal homogeneous uptake in all areas of the myocardium. Rest Images:  Normal homogeneous uptake in all areas of the myocardium. Subtraction (SDS):  No evidence of ischemia. Transient Ischemic Dilatation (Normal <1.22):  1.10 Lung/Heart Ratio (Normal <0.45):  0.30  Quantitative Gated Spect Images QGS EDV:  100 ml QGS ESV:  47 ml  Impression Exercise Capacity:  Lexiscan with no exercise. BP Response:  Normal blood pressure response. Clinical Symptoms:  No significant symptoms noted. ECG Impression:  No significant ST segment change suggestive of ischemia. Comparison with Prior Nuclear Study: No images to compare  Overall Impression:  Normal stress nuclear study.  LV Ejection Fraction: 53%.  LV Wall Motion:  NL LV Function; NL Wall Motion  [employer]

## 2012-07-05 ENCOUNTER — Other Ambulatory Visit: Payer: Self-pay | Admitting: *Deleted

## 2012-07-05 ENCOUNTER — Telehealth: Payer: Self-pay | Admitting: Cardiovascular Disease

## 2012-07-05 MED ORDER — LISINOPRIL 40 MG PO TABS: 40.0000 mg | ORAL_TABLET | Freq: Every day | ORAL | Status: DC

## 2012-07-05 NOTE — Telephone Encounter (Signed)
New problem    Calling to see if blood work was done when he came into the office.

## 2012-07-05 NOTE — Telephone Encounter (Signed)
Pt request 2 month supply

## 2012-07-05 NOTE — Telephone Encounter (Signed)
LMTCB ./CY 

## 2012-10-06 ENCOUNTER — Encounter: Payer: Self-pay | Admitting: Internal Medicine

## 2012-10-06 ENCOUNTER — Ambulatory Visit (INDEPENDENT_AMBULATORY_CARE_PROVIDER_SITE_OTHER): Payer: PRIVATE HEALTH INSURANCE | Admitting: Internal Medicine

## 2012-10-06 VITALS — BP 165/100 | HR 66 | Temp 98.2°F | Ht 68.5 in | Wt 197.1 lb

## 2012-10-06 DIAGNOSIS — I10 Essential (primary) hypertension: Secondary | ICD-10-CM

## 2012-10-06 DIAGNOSIS — K029 Dental caries, unspecified: Secondary | ICD-10-CM | POA: Insufficient documentation

## 2012-10-06 MED ORDER — LISINOPRIL 40 MG PO TABS: 40.0000 mg | ORAL_TABLET | Freq: Every day | ORAL | Status: DC

## 2012-10-06 MED ORDER — PROPRANOLOL HCL 40 MG PO TABS: ORAL_TABLET | ORAL | Status: DC

## 2012-10-06 MED ORDER — HYDROCHLOROTHIAZIDE 25 MG PO TABS: 25.0000 mg | ORAL_TABLET | Freq: Every day | ORAL | Status: DC

## 2012-10-06 NOTE — Assessment & Plan Note (Signed)
Blood pressure has been within normal limits most of the time. Recheck during next visit and if elevated consider checking compliance and adding another medication. - Refill for lisinopril, propranolol and HCTZ for one year provided.

## 2012-10-06 NOTE — Progress Notes (Signed)
Case discussed with Dr. Patel (at time of visit, soon after the resident saw the patient).  We reviewed the resident's history and exam and pertinent patient test results.  I agree with the assessment, diagnosis, and plan of care documented in the resident's note. 

## 2012-10-06 NOTE — Assessment & Plan Note (Signed)
Dentist referral placed. ?

## 2012-10-06 NOTE — Progress Notes (Signed)
  Subjective:    Patient ID: Michael Day, male    DOB: 1945-07-13, 67 y.o.   MRN: [id number]  HPI patient is a pleasant 67 year old man with hypertension, osteoarthritis, tobacco use and other problems as per problem list who comes the clinic for dental referral.  He has dental caries and other problems with his teeth of which he is referral to dentist. He has had previous dental procedures done.  There is one open wire on the right upper tooth.  He denies any fever, chills, nausea, vomiting or abdominal pain, chest pain, short of breath, diarrhea, headache, palpitations.  He has seen cardiology recently and had negative Myoview.  Review of Systems    as per history of present illness Objective:   Physical Exam  General: NAD HEENT: PERRL, EOMI, no scleral icterus. Dental caries and multiple tooth missing with one open wire. Cardiac: S1, S2, RRR, no rubs, murmurs or gallops Pulm: clear to auscultation bilaterally, moving normal volumes of air Abd: soft, nontender, nondistended, BS present Ext: warm and well perfused, no pedal edema Neuro: alert and oriented X3, cranial nerves II-XII grossly intact       Assessment & Plan:

## 2012-10-06 NOTE — Patient Instructions (Signed)
Please make a followup appointment in 2-3 months.  Followup with dentist.  Continue exercise and try to lose weight is Dr. Eden Emms explained.  Keep trying to stop smoking. Call for any help if needed.  Continue taking all medications regularly as you do.

## 2012-11-08 ENCOUNTER — Telehealth: Payer: Self-pay | Admitting: *Deleted

## 2012-11-08 NOTE — Telephone Encounter (Signed)
Pt called upset with [facility] - had appt at Dental clinic - would not see [facility] card expired 10/2012. Needs to [facility] as soon as possible. Needs to call [facility] and sch appt with Rudell Cobb for new card. Suggest for pt to call Dental clinic as soon as he gets his new card about appt. Stanton Kidney Zayde Stroupe RN 11/08/12 2:30PM

## 2012-11-11 ENCOUNTER — Ambulatory Visit: Payer: Self-pay

## 2013-03-27 ENCOUNTER — Ambulatory Visit (INDEPENDENT_AMBULATORY_CARE_PROVIDER_SITE_OTHER): Payer: Medicare Other | Admitting: Internal Medicine

## 2013-03-27 ENCOUNTER — Encounter: Payer: Self-pay | Admitting: Internal Medicine

## 2013-03-27 VITALS — BP 128/83 | HR 64 | Temp 97.0°F | Ht 68.5 in | Wt 189.7 lb

## 2013-03-27 DIAGNOSIS — R079 Chest pain, unspecified: Secondary | ICD-10-CM

## 2013-03-27 DIAGNOSIS — I10 Essential (primary) hypertension: Secondary | ICD-10-CM

## 2013-03-27 DIAGNOSIS — F17200 Nicotine dependence, unspecified, uncomplicated: Secondary | ICD-10-CM

## 2013-03-27 DIAGNOSIS — K219 Gastro-esophageal reflux disease without esophagitis: Secondary | ICD-10-CM

## 2013-03-27 DIAGNOSIS — I129 Hypertensive chronic kidney disease with stage 1 through stage 4 chronic kidney disease, or unspecified chronic kidney disease: Secondary | ICD-10-CM

## 2013-03-27 DIAGNOSIS — N183 Chronic kidney disease, stage 3 unspecified: Secondary | ICD-10-CM

## 2013-03-27 DIAGNOSIS — Z0000 Encounter for general adult medical examination without abnormal findings: Secondary | ICD-10-CM

## 2013-03-27 DIAGNOSIS — E785 Hyperlipidemia, unspecified: Secondary | ICD-10-CM

## 2013-03-27 DIAGNOSIS — F1011 Alcohol abuse, in remission: Secondary | ICD-10-CM

## 2013-03-27 DIAGNOSIS — Z125 Encounter for screening for malignant neoplasm of prostate: Secondary | ICD-10-CM

## 2013-03-27 DIAGNOSIS — Z720 Tobacco use: Secondary | ICD-10-CM

## 2013-03-27 HISTORY — DX: Chronic kidney disease, stage 3 unspecified: N18.30

## 2013-03-27 LAB — CBC
HCT: 45.9 % (ref 39.0–52.0)
Hemoglobin: 15.7 g/dL (ref 13.0–17.0)
MCH: 33.6 pg (ref 26.0–34.0)
MCV: 98.3 fL (ref 78.0–100.0)
RBC: 4.67 MIL/uL (ref 4.22–5.81)
WBC: 6.1 10*3/uL (ref 4.0–10.5)

## 2013-03-27 MED ORDER — VARENICLINE TARTRATE 0.5 MG X 11 & 1 MG X 42 PO MISC: ORAL | Status: DC

## 2013-03-27 MED ORDER — OMEPRAZOLE 20 MG PO CPDR: 20.0000 mg | DELAYED_RELEASE_CAPSULE | Freq: Every day | ORAL | Status: DC

## 2013-03-27 NOTE — Patient Instructions (Addendum)
General Instructions: You have been prescribed Chantix to help you quit smoking.   -Start taking Chantix 1 week before target quit date.   - On Days 1 to 3: take 0.5 mg once daily  - On Days 4 to 7: take 0.5 mg twice daily   - On Day 8 and afterwards: take 1 mg twice daily, continue for 11 weeks -You may call 1-800-QUIT-NOW for further help in quitting smoking.  -Start taking folate and vitamin B12 for your general health.  -Follow up with [place] in two weeks or sooner if your have side effects while taking Chantix.     Treatment Goals:  Goals (1 Years of Data) as of 03/27/13         As of Today 10/06/12 06/28/12 06/21/12 06/02/12     Blood Pressure    . Blood Pressure < 140/90  152/93 165/100 128/81 154/96 140/90     Lifestyle    . Quit smoking / using tobacco            Progress Toward Treatment Goals:  Treatment Goal 03/27/2013  Blood pressure improved  Stop smoking smoking the same amount    Self Care Goals & Plans:  Self Care Goal 03/27/2013  Manage my medications take my medicines as prescribed; bring my medications to every visit; refill my medications on time  Monitor my health keep track of my blood pressure; bring my blood pressure log to each visit  Eat healthy foods eat foods that are low in salt; eat baked foods instead of fried foods  Be physically active take a walk every day; find an activity I enjoy  Stop smoking go to the [employer] (Pumpkin[employer].com.ee); call [facility] (1-800-QUIT-NOW)    No flowsheet data found.   Care Management & Community Referrals:  Referral 03/27/2013  Referrals made for care management support none needed

## 2013-03-28 ENCOUNTER — Other Ambulatory Visit: Payer: Self-pay | Admitting: *Deleted

## 2013-03-28 DIAGNOSIS — Z0000 Encounter for general adult medical examination without abnormal findings: Secondary | ICD-10-CM | POA: Insufficient documentation

## 2013-03-28 LAB — COMPLETE METABOLIC PANEL WITH GFR
AST: 24 U/L (ref 0–37)
Albumin: 4.2 g/dL (ref 3.5–5.2)
BUN: 15 mg/dL (ref 6–23)
Calcium: 9.4 mg/dL (ref 8.4–10.5)
Chloride: 104 mEq/L (ref 96–112)
Creat: 1.36 mg/dL — ABNORMAL HIGH (ref 0.50–1.35)
GFR, Est African American: 63 mL/min
GFR, Est Non African American: 54 mL/min — ABNORMAL LOW
Glucose, Bld: 90 mg/dL (ref 70–99)
Potassium: 4.3 mEq/L (ref 3.5–5.3)

## 2013-03-28 LAB — LIPID PANEL
Cholesterol: 202 mg/dL — ABNORMAL HIGH (ref 0–200)
Triglycerides: 290 mg/dL — ABNORMAL HIGH (ref <150)
VLDL: 58 mg/dL — ABNORMAL HIGH (ref 0–40)

## 2013-03-28 NOTE — Progress Notes (Signed)
Case discussed with Dr. Kennerly soon after the resident saw the patient.  We reviewed the resident's history and exam and pertinent patient test results.  I agree with the assessment, diagnosis, and plan of care documented in the resident's note. 

## 2013-03-28 NOTE — Assessment & Plan Note (Signed)
  Assessment: Progress toward smoking cessation:  smoking the same amount Barriers to progress toward smoking cessation:  withdrawal symptoms Comments: He has a 50 pack-year smoking history. He is very interested in quitting smoking. He has tried nicotine gum with no success. He has not tried nicotine patches and wants to try Chantix now.   Plan: Instruction/counseling given:  I counseled patient on the dangers of tobacco use, advised patient to stop smoking, and reviewed strategies to maximize success. Educational resources provided:  [facility] [employer]) brochure Self management tools provided:  smoking cessation plan (STAR Quit Plan) Medications to assist with smoking cessation:  Varenicline (Chantix) Patient agreed to the following self-care plans for smoking cessation: go to the [employer] (www.quitlinenc.com);call [facility] (1-800-QUIT-NOW)  Other plans: Instructed him to start Chantix only when he is ready to quit. Provided gradual increased dose instruction and packet. He will follow up in 2weeks for monitoring of the possible side effects with this medication which were explained to him and include vivid dreams, depression, and suicidal thoughts. He agreed to follow up in 2 weeks.

## 2013-03-28 NOTE — Progress Notes (Signed)
  Subjective:    Patient ID: Michael Day, male    DOB: 1946-02-28, 67 y.o.   MRN: [id number]  HPI Michael Day is a 67 year old man with PMH of tobacco dependence, alcohol abuse, and HTN who presents for routine follow up visit. He states that he has been walking almost 1 mile every day and has had no chest pain recently. He continues to drink a 5th of liquor that lasts throughout the on the weekend and another 5th that lasts during the week. He would like to quit smoking and requests prescription for Chantix as nicotine gum has not worked for him in the past. He also requests prostate cancer screening as his father had prostate cancer. He denies urinary symptoms such as hesitancy, retention, decreased stream, or increased frequency. He also denies melena, hematemesis, or hematochezia, blackouts, or falls.    Review of Systems  Constitutional: Negative for fever, chills, diaphoresis, activity change, appetite change, fatigue and unexpected weight change.  Respiratory: Negative for cough and shortness of breath.   Cardiovascular: Negative for chest pain, palpitations and leg swelling.  Gastrointestinal: Negative for nausea, vomiting, abdominal pain, diarrhea and constipation.  Genitourinary: Negative for dysuria, frequency, decreased urine volume and difficulty urinating.  Musculoskeletal: Negative for back pain.  Skin: Negative for color change, pallor, rash and wound.  Neurological: Negative for dizziness, weakness, light-headedness and headaches.  Hematological: Negative for adenopathy.  Psychiatric/Behavioral: Negative for behavioral problems and agitation.       Objective:   Physical Exam  Nursing note and vitals reviewed. Constitutional: He is oriented to person, place, and time. He appears well-nourished. No distress.  Obese, wearing sunglasses    Cardiovascular: Normal rate and regular rhythm.   Pulmonary/Chest: Effort normal and breath sounds normal. No respiratory distress. He has  no wheezes. He has no rales.  Abdominal: Soft. Bowel sounds are normal. He exhibits no distension and no mass. There is no tenderness. There is no rebound and no guarding.  Obese abdominal, could not appreciate liver edge secondary to body habitus.   Musculoskeletal: Normal range of motion. He exhibits no edema and no tenderness.  Neurological: He is alert and oriented to person, place, and time. Coordination normal.  Fine tremor of bilateral hands at rest.   Skin: Skin is warm and dry. No rash noted. He is not diaphoretic. No erythema. No pallor.  Psychiatric: He has a normal mood and affect. His behavior is normal.          Assessment & Plan:

## 2013-03-28 NOTE — Assessment & Plan Note (Addendum)
No chest pain recently but LDL at 111, t Chole of 202, elevated trig to 290, HDl low at 33. LFTs normal, will consider starting statin during his next visit for LDL goal <100.

## 2013-03-28 NOTE — Assessment & Plan Note (Addendum)
He denies recent falls or blackouts. He states that he has stopped taking folate and B12 on his own.   I encouraged him to take a multivitamin with B12 and folate and he agrees to do so.   Will provide cessation counseling during his next visit in 2 weeks.   Of note, CBC and LFTs normal. CMP with elevated Creatinine but close to baseline of 1.3.

## 2013-03-28 NOTE — Assessment & Plan Note (Addendum)
BP Readings from Last 3 Encounters:  03/27/13 128/83  10/06/12 165/100  06/28/12 128/81    Lab Results  Component Value Date   NA 140 03/27/2013   K 4.3 03/27/2013   CREATININE 1.36* 03/27/2013    Assessment: Blood pressure control: mildly elevated Progress toward BP goal:  improved Comments: He is on HCTZ 25mg  daily, lisinopril 40mg  daily, and propranolol 40mg  BID.    Plan: Medications:  continue current medications Educational resources provided: brochure Self management tools provided: home blood pressure logbook Other plans: Follow up in 2 weeks.

## 2013-03-28 NOTE — Assessment & Plan Note (Addendum)
Stable creatinine per CMP on 03/27/13. Pt appears to be unaware of this diagnosis.   Will discuss findings and obtain urine for micro/Cr during his next visit.

## 2013-03-28 NOTE — Assessment & Plan Note (Signed)
He declined influenza, Pneumovax, and Tdap vaccines during this visit.  PSA checked per his request with normal value <4. Pt is asymptomatic for prostate cancer but his father had prostate cancer. Will discuss results during his next visit.

## 2013-03-29 NOTE — Telephone Encounter (Signed)
Pt has refills.

## 2013-04-06 ENCOUNTER — Telehealth: Payer: Self-pay | Admitting: *Deleted

## 2013-04-06 NOTE — Telephone Encounter (Signed)
Pt calls worried that chantix may cause his continuing chest discomfort to increase/ become worse. He has not started the chantix as of yet, he would like to speak to you about this and would like for you to call him at [phone]

## 2013-04-07 NOTE — Telephone Encounter (Signed)
Called Mr. Budreau at home, his question was in regards to his skipped beats. He tells me that are happening more often recently, maybe 2 per week and he wanted to know if Chantix would make this worse. I explained to him that there is a 4% side effect occurrence of angina with Chantix but necessarily skipped heart beats.  I advised him to call his Cardiologist (he was seen by Dr Eden Emms in January) and to discuss the worsening of his skipped beats.  He agreed to call them and make an appointment and will wait to be seen there before starting Chantix.

## 2013-04-10 ENCOUNTER — Ambulatory Visit (INDEPENDENT_AMBULATORY_CARE_PROVIDER_SITE_OTHER): Payer: Medicare Other | Admitting: Internal Medicine

## 2013-04-10 ENCOUNTER — Encounter: Payer: Self-pay | Admitting: Internal Medicine

## 2013-04-10 VITALS — BP 142/91 | HR 73 | Temp 98.8°F | Ht 68.5 in | Wt 189.7 lb

## 2013-04-10 DIAGNOSIS — I459 Conduction disorder, unspecified: Secondary | ICD-10-CM

## 2013-04-10 DIAGNOSIS — G250 Essential tremor: Secondary | ICD-10-CM

## 2013-04-10 DIAGNOSIS — I499 Cardiac arrhythmia, unspecified: Secondary | ICD-10-CM

## 2013-04-10 DIAGNOSIS — I10 Essential (primary) hypertension: Secondary | ICD-10-CM

## 2013-04-10 NOTE — Progress Notes (Signed)
Patient ID: Travonne Schowalter, male   DOB: 03/07/1946, 67 y.o.   MRN: [id number]   Subjective:   Patient ID: Jerrin Recore male   DOB: [birth year] 67 y.o.   MRN: [id number]  HPI: Mr.Kilo Fees is a 67 y.o. Male with PMH hypertension, osteoarthritis, essential tremor, and chronic kidney disease. Presented today for routine followup visit. No complaints today. Please problem based chatting for review of his chronic medical conditions.  Past Medical History  Diagnosis Date  . Hypertension   . Arthritis   . Adenomatous polyp of colon 2005    3 polyps removal per colonoscopy  . Tobacco dependence   . Tremor, essential   . Alcohol use    Current Outpatient Prescriptions  Medication Sig Dispense Refill  . fluticasone (FLONASE) 50 MCG/ACT nasal spray Place 2 sprays into the nose daily.      . hydrochlorothiazide (HYDRODIURIL) 25 MG tablet Take 1 tablet (25 mg total) by mouth daily.  30 tablet  11  . lisinopril (PRINIVIL,ZESTRIL) 40 MG tablet Take 1 tablet (40 mg total) by mouth daily.  30 tablet  11  . omeprazole (PRILOSEC) 20 MG capsule Take 1 capsule (20 mg total) by mouth daily.  30 capsule  11  . propranolol (INDERAL) 40 MG tablet TAKE ONE TABLET BY MOUTH TWICE DAILY  60 tablet  11  . varenicline (CHANTIX PAK) 0.5 MG X 11 & 1 MG X 42 tablet Take one 0.5 mg tablet by mouth once daily for 3 days, then increase to one 0.5 mg tablet twice daily for 4 days, then increase to one 1 mg tablet twice daily.  53 tablet  0   No current facility-administered medications for this visit.   Family History  Problem Relation Age of Onset  . Stroke Mother   . Prostate cancer Father    History   Social History  . Marital Status: Divorced    Spouse Name: N/A    Number of Children: N/A  . Years of Education: N/A   Social History Main Topics  . Smoking status: Current Every Day Smoker -- 1.00 packs/day    Types: Cigarettes  . Smokeless tobacco: None  . Alcohol Use: 2.5 - 3 oz/week    5-6 drink(s)  per week     Comment: couple shots/ day  . Drug Use: No  . Sexual Activity: None   Other Topics Concern  . None   Social History Narrative   lives in [place].   Review of Systems: CONSTITUTIONAL- No Fever, weightloss, night sweat, or change in appetite. SKIN- No Rash, colour changes, itching. HEAD-No  Headache, or dizziness. EYES- Vision loss, pain, redness, double or blurred vision. RESPIRATORY- No Cough, or SOB. CARDIAC- Complainst of occasional irregular beats, No DOE, PND, or chest pain. GI- No vomiting, diarrhoea, constipation, abd pain. URINARY- Frequency, polyuria, nocturia, hesitancy, urgency,incontinence NEUROLOGIC- Numbness, syncope, burning.  Objective:  Physical Exam: Filed Vitals:   04/10/13 0910 04/10/13 1021  BP: 148/98 142/91  Pulse: 78 73  Temp: 98.8 F (37.1 C)   TempSrc: Oral   Height: 5' 8.5" (1.74 m)   Weight: 189 lb 11.2 oz (86.047 kg)   SpO2: 97%    GENERAL- alert, co-operative, appears as stated age, not in any distress. HEENT- Atraumatic, normocephalic, PERRL, EOMI, oral mucosa appears moist, no cervical LN enlargement, thyroid does not appear enlarged. CARDIAC- RRR, no murmurs, rubs or gallops. RESP- Moving equal volumes of air, and clear to auscultation bilaterally. ABDOMEN- Soft, nontender,  no palpable masses  or organomegaly, bowel sounds present. BACK- Normal curvature of the spine, No tenderness along the vertebrae, no CVA tenderness. NEURO- No obvious Cr N abnormality, strenght equal and present in all extremities. EXTREMITIES- No pedal edema. SKIN- Warm, dry, No rash or lesion. PSYCH- Normal mood and affect, appropriate thought content and speech.  Assessment & Plan:  The patient's case and plan of care was discussed with attending physician, Dr. Dorris Singh.  Please see problem based chatting for assessment and plan.

## 2013-04-10 NOTE — Assessment & Plan Note (Signed)
Says last episode was 5 days ago, believs the Chantrix he takes is causing it, so has stopped medication for now. Has appoitment to see his cardiologist on the 8th of November.  Plan- Encourage pt to make his appoitment. And in her - follow up on cardiologist recs. And

## 2013-04-10 NOTE — Assessment & Plan Note (Signed)
Compliant with his meds, no Lightheadedness, leg swelling or cramps.  Vitals - 1 value per visit 04/10/2013 03/27/2013 10/06/2012  SYSTOLIC 142 128 409  DIASTOLIC 91 83 100   Lab Results    Component   Value      Date  NA                  140       03/27/2013  K                    4.3         03/27/2013  CREATININE  1.36*     03/27/2013   Assessment:  Blood pressure control: mildly elevated  Progress toward BP goal: improved  Comments: He is on HCTZ 25mg  daily, lisinopril 40mg  daily, and propranolol 40mg  BID.   Plan:  Medications: continue current medications.

## 2013-04-10 NOTE — Assessment & Plan Note (Signed)
Pt reports improvement with Propanolol. But worse symptoms when he takes coffee in the mornings.  Plan- Conitinue Propanolol- and discourage coffee intake.

## 2013-04-10 NOTE — Patient Instructions (Signed)
Please keep your appoitmnet with your cardiologist as already scheduled. We will follow up on his recommendations.  Please also continue taking all your medications.  We will see you in 3 months.

## 2013-04-18 NOTE — Progress Notes (Signed)
I saw and evaluated the patient.  I personally confirmed the key portions of the history and exam documented by Dr. Emokpae and I reviewed pertinent patient test results.  The assessment, diagnosis, and plan were formulated together and I agree with the documentation in the resident's note. 

## 2013-04-24 ENCOUNTER — Encounter: Payer: Self-pay | Admitting: Cardiovascular Disease

## 2013-04-24 ENCOUNTER — Ambulatory Visit (INDEPENDENT_AMBULATORY_CARE_PROVIDER_SITE_OTHER): Payer: Medicare Other | Admitting: Cardiovascular Disease

## 2013-04-24 VITALS — BP 140/92 | HR 80 | Ht 68.0 in | Wt 190.8 lb

## 2013-04-24 DIAGNOSIS — I10 Essential (primary) hypertension: Secondary | ICD-10-CM

## 2013-04-24 DIAGNOSIS — R079 Chest pain, unspecified: Secondary | ICD-10-CM

## 2013-04-24 DIAGNOSIS — F17200 Nicotine dependence, unspecified, uncomplicated: Secondary | ICD-10-CM

## 2013-04-24 NOTE — Patient Instructions (Signed)
Your physician wants you to follow-up in:   YEAR WITH DR Haywood Filler will receive a reminder letter in the mail two months in advance. If you don't receive a letter, please call our office to schedule the follow-up appointment. Your physician recommends that you continue on your current medications as directed. Please refer to the Current Medication list given to you today. MAKE SURE  TAKE  CHANTIX  AS  DIRECTED

## 2013-04-24 NOTE — Progress Notes (Signed)
Patient ID: Michael Day, male   DOB: 02/11/1946, 67 y.o.   MRN: [id number] Pt with hx significant for htn, tobacco abuse and prior spontaneous pneumothorax 2 years ago with subsequent left chest tube placement who presents for evaluation of "irregular heart beats". Pt states that a couple of days ago he felt an "irregular heart beat like before" that lasted less than a few minutes and the sensation that "something grabbed my heart". Pt states that he was "checked out" for this previously. Of note, there is no documentation evident in EPIC for palpitations or arrhythmia. He has been assessed for left chest pain after his chest tube placement. Only previous EKG is from 07/2009 when he was admitted for spontaneous pneumothorax. He denies chest pain, diaphoresis, nausea, light-headness, weakness or shortness of breath. He continues to smoke ~1ppd of cigarettes. He gets intermitant atypical chest pain. Positional Feels like something is grabbing his heart.   F/U myovue normal 06/21/12  Echo 06/07/12 normal  Has not been taking Chantix.  Counseled him about its importance and how nicitine is one of the causes of his palpitations and hard to control BP Compliant with meds  ROS: Denies fever, malais, weight loss, blurry vision, decreased visual acuity, cough, sputum, SOB, hemoptysis, pleuritic pain, palpitaitons, heartburn, abdominal pain, melena, lower extremity edema, claudication, or rash.  All other systems reviewed and negative  General: Affect appropriate Chronically ill black male  HEENT: normal Neck supple with no adenopathy JVP normal no bruits no thyromegaly Lungs clear with no wheezing and good diaphragmatic motion Heart:  S1/S2 no murmur, no rub, gallop or click PMI normal Abdomen: benighn, BS positve, no tenderness, no AAA no bruit.  No HSM or HJR Distal pulses intact with no bruits No edema Neuro non-focal Skin warm and dry No muscular weakness   Current Outpatient Prescriptions   Medication Sig Dispense Refill  . hydrochlorothiazide (HYDRODIURIL) 25 MG tablet Take 1 tablet (25 mg total) by mouth daily.  30 tablet  11  . lisinopril (PRINIVIL,ZESTRIL) 40 MG tablet Take 1 tablet (40 mg total) by mouth daily.  30 tablet  11  . omeprazole (PRILOSEC) 20 MG capsule Take 1 capsule (20 mg total) by mouth daily.  30 capsule  11  . propranolol (INDERAL) 40 MG tablet TAKE ONE TABLET BY MOUTH TWICE DAILY  60 tablet  11  . varenicline (CHANTIX PAK) 0.5 MG X 11 & 1 MG X 42 tablet Take one 0.5 mg tablet by mouth once daily for 3 days, then increase to one 0.5 mg tablet twice daily for 4 days, then increase to one 1 mg tablet twice daily.  53 tablet  0  . fluticasone (FLONASE) 50 MCG/ACT nasal spray Place 2 sprays into the nose daily.       No current facility-administered medications for this visit.    Allergies  Review of patient's allergies indicates no known allergies.  Electrocardiogram: 04/17/12  SR normal   Assessment and Plan

## 2013-04-24 NOTE — Assessment & Plan Note (Signed)
Resolved Normal myovue 2/14

## 2013-04-24 NOTE — Assessment & Plan Note (Signed)
Will start Chantix  F/u primary at [facility]

## 2013-04-24 NOTE — Assessment & Plan Note (Signed)
Continue current meds Should be better off cigarettes.  Low sodium diet

## 2013-05-31 ENCOUNTER — Other Ambulatory Visit: Payer: Self-pay | Admitting: *Deleted

## 2013-05-31 DIAGNOSIS — Z720 Tobacco use: Secondary | ICD-10-CM

## 2013-05-31 NOTE — Telephone Encounter (Signed)
Pt calls and states he needs to start the chantix, also he would like to speak to dr Hayes Ludwig if possible, read note from visit with dr Johnsie Cancel. He agrees pt needs to start chantix asap. Please send to pharm. Call pt at [phone]

## 2013-06-01 ENCOUNTER — Other Ambulatory Visit: Payer: Self-pay | Admitting: Internal Medicine

## 2013-06-01 DIAGNOSIS — Z720 Tobacco use: Secondary | ICD-10-CM

## 2013-06-01 MED ORDER — VARENICLINE TARTRATE 0.5 MG X 11 & 1 MG X 42 PO MISC: ORAL | Status: DC

## 2013-06-02 NOTE — Telephone Encounter (Signed)
Rx called in 

## 2013-07-10 ENCOUNTER — Encounter: Payer: Medicare Other | Admitting: Internal Medicine

## 2013-07-31 ENCOUNTER — Encounter: Payer: Self-pay | Admitting: Internal Medicine

## 2013-07-31 ENCOUNTER — Ambulatory Visit (INDEPENDENT_AMBULATORY_CARE_PROVIDER_SITE_OTHER): Payer: Medicare Other | Admitting: Internal Medicine

## 2013-07-31 VITALS — BP 145/94 | HR 69 | Temp 97.5°F | Wt 192.5 lb

## 2013-07-31 DIAGNOSIS — F1010 Alcohol abuse, uncomplicated: Secondary | ICD-10-CM

## 2013-07-31 DIAGNOSIS — I129 Hypertensive chronic kidney disease with stage 1 through stage 4 chronic kidney disease, or unspecified chronic kidney disease: Secondary | ICD-10-CM

## 2013-07-31 DIAGNOSIS — N189 Chronic kidney disease, unspecified: Secondary | ICD-10-CM

## 2013-07-31 DIAGNOSIS — Z0000 Encounter for general adult medical examination without abnormal findings: Secondary | ICD-10-CM

## 2013-07-31 DIAGNOSIS — F17200 Nicotine dependence, unspecified, uncomplicated: Secondary | ICD-10-CM

## 2013-07-31 DIAGNOSIS — M542 Cervicalgia: Secondary | ICD-10-CM

## 2013-07-31 DIAGNOSIS — I10 Essential (primary) hypertension: Secondary | ICD-10-CM

## 2013-07-31 DIAGNOSIS — N183 Chronic kidney disease, stage 3 unspecified: Secondary | ICD-10-CM

## 2013-07-31 MED ORDER — FOLIC ACID 1 MG PO TABS: 1.0000 mg | ORAL_TABLET | Freq: Every day | ORAL | Status: DC

## 2013-07-31 MED ORDER — THIAMINE HCL 100 MG PO TABS: 100.0000 mg | ORAL_TABLET | Freq: Every day | ORAL | Status: DC

## 2013-07-31 MED ORDER — AMLODIPINE BESYLATE 5 MG PO TABS: 5.0000 mg | ORAL_TABLET | Freq: Every day | ORAL | Status: DC

## 2013-07-31 NOTE — Patient Instructions (Addendum)
General Instructions: -We have ordered an Xray of your neck. You may have this done here at your earliest convenience.  -Start taking amlodipine 5mg  daily for your blood pressure. Continue taking your other medications.  -Start taking thiamine 100mg  per day and folate 1g per day (you may take folate 439mcg as over-the-counter).  -Follow up in 2 weeks for blood pressure recheck and to discuss your lab and xray findings.     Treatment Goals:  Goals (1 Years of Data) as of 07/31/13         As of Today 04/24/13 04/10/13 04/10/13 03/27/13     Blood Pressure    . Blood Pressure < 140/90  148/101 140/92 142/91 148/98 128/83     Lifestyle    . Quit smoking / using tobacco            Progress Toward Treatment Goals:  Treatment Goal 07/31/2013  Blood pressure deteriorated  Stop smoking smoking less    Self Care Goals & Plans:  Self Care Goal 07/31/2013  Manage my medications take my medicines as prescribed; refill my medications on time; bring my medications to every visit  Monitor my health -  Eat healthy foods eat foods that are low in salt; eat baked foods instead of fried foods; eat more vegetables  Be physically active find an activity I enjoy; take a walk every day  Stop smoking call [facility] (1-800-QUIT-NOW); cut down the number of cigarettes smoked; go to the [employer] (https://sc[facility].info/)    No flowsheet data found.   Care Management & Community Referrals:  Referral 03/27/2013  Referrals made for care management support none needed

## 2013-08-01 LAB — URINALYSIS, ROUTINE W REFLEX MICROSCOPIC
Glucose, UA: NEGATIVE mg/dL
Hgb urine dipstick: NEGATIVE
Ketones, ur: NEGATIVE mg/dL
LEUKOCYTES UA: NEGATIVE
NITRITE: NEGATIVE
PH: 5.5 (ref 5.0–8.0)
Protein, ur: NEGATIVE mg/dL
Specific Gravity, Urine: 1.028 (ref 1.005–1.030)
Urobilinogen, UA: 1 mg/dL (ref 0.0–1.0)

## 2013-08-01 LAB — MICROALBUMIN / CREATININE URINE RATIO
Creatinine, Urine: 435.4 mg/dL
Microalb Creat Ratio: 1.5 mg/g (ref 0.0–30.0)
Microalb, Ur: 0.67 mg/dL (ref 0.00–1.89)

## 2013-08-02 NOTE — Assessment & Plan Note (Addendum)
Checked a urine microalbumin/Cr during this visit which was normal, UA with no protein.   Will check iPTH, Vitamin D25, phosphorus during next visit.

## 2013-08-02 NOTE — Assessment & Plan Note (Signed)
BP Readings from Last 3 Encounters:  07/31/13 145/94  04/24/13 140/92  04/10/13 142/91    Lab Results  Component Value Date   NA 140 03/27/2013   K 4.3 03/27/2013   CREATININE 1.36* 03/27/2013    Assessment: Blood pressure control: mildly elevated Progress toward BP goal:  deteriorated Comments: He is on lisinopril 40mg  daily, propranolol 40mg  daily, and HCTZ 25mg  daily. His BP is elevated during this visit, given his CKD 3, tight BP control is strongly recommended.   Plan: Medications:  continue current medications. Will start Norvasc 5mg  daily.  Educational resources provided: brochure Self management tools provided: home blood pressure logbook Other plans: Pt agrees to return in 2-3 weeks for blood pressure recheck.

## 2013-08-02 NOTE — Progress Notes (Signed)
   Subjective:    Patient ID: Michael Day, male    DOB: 21-Sep-1945, 68 y.o.   MRN: [id number]  Hypertension Associated symptoms include neck pain. Pertinent negatives include no chest pain, headaches, palpitations or shortness of breath.  Neck Pain  Pertinent negatives include no chest pain, fever, headaches or numbness.   [name] is a 68 yo man with PMH of CKD stage 3, alcohol abuse, HTN, tobacco abuse, who presents for follow up for his BP and for evaluation of right neck pain with pins and needles pain. He has followed up with his Cardiologist with no indication for further work up for his chest pain. His chest pain has not recurred recently.  He continues to smoke but is smoking less, 1/2 pack per day. He had tried Chantix but states that it did not help him much. Nicotine gum was more effective for him.  His right neck pain has been present for almost 10 years, it comes and goes, it it described as "pins and needles" that shoot down his neck to his right shoulder but never to his arm or hand. He denies arm weakness or hand weakness.     Review of Systems  Constitutional: Negative for fever, chills, diaphoresis, activity change, appetite change and fatigue.  Respiratory: Negative for chest tightness, shortness of breath and wheezing.   Cardiovascular: Negative for chest pain, palpitations and leg swelling.  Gastrointestinal: Negative for abdominal pain.  Genitourinary: Negative for dysuria.  Musculoskeletal: Positive for neck pain.  Neurological: Negative for dizziness, light-headedness, numbness and headaches.  Psychiatric/Behavioral: Negative for agitation.       Objective:   Physical Exam  Nursing note and vitals reviewed. Constitutional: He is oriented to person, place, and time. He appears well-nourished. No distress.  Wearing sunglasses   HENT:  Head: Atraumatic.  Eyes:  Bilateral red conjunctiva  Cardiovascular: Normal rate and regular rhythm.   Pulmonary/Chest:  Effort normal and breath sounds normal. No respiratory distress. He has no wheezes. He has no rales.  Abdominal: Soft. Bowel sounds are normal. He exhibits no distension. There is no tenderness.  Musculoskeletal: He exhibits no edema and no tenderness.  Neurological: He is alert and oriented to person, place, and time.  Postural tremor with no tremor at rest.   Skin: Skin is warm and dry. He is not diaphoretic.  Psychiatric: He has a normal mood and affect.          Assessment & Plan:

## 2013-08-02 NOTE — Assessment & Plan Note (Signed)
  Assessment: Progress toward smoking cessation:  smoking less Barriers to progress toward smoking cessation:  alcohol use Comments: He is actively trying to quit and I commanded him for his efforts. He feels like Chantix has not helped him much but he is willing to give it one more try. He is also thinking about using electronic cigarettes or nicotine gum to help her quit.   Plan: Instruction/counseling given:  I counseled patient on the dangers of tobacco use, advised patient to stop smoking, and reviewed strategies to maximize success. Educational resources provided:  [facility] [employer] account manager) brochure Self management tools provided:  smoking cessation plan ([facility] Quit Plan) Medications to assist with smoking cessation:  Nicotine Gum and chantix Patient agreed to the following self-care plans for smoking cessation: call [facility] (1-800-QUIT-NOW);cut down the number of cigarettes smoked;go to the [employer] (https://sc[employer].info/)  Other plans: Continue counseling

## 2013-08-02 NOTE — Assessment & Plan Note (Addendum)
Continues to drink the same amount. Has never done AA, not interested in starting alcohol detox/rehab at this time.  Pt advised to start thiamine 100mg  dialy, folate 1g daily

## 2013-08-02 NOTE — Assessment & Plan Note (Signed)
He declined vaccination during this visit.

## 2013-08-04 NOTE — Progress Notes (Signed)
Case discussed with Dr. Kennerly soon after the resident saw the patient.  We reviewed the resident's history and exam and pertinent patient test results.  I agree with the assessment, diagnosis, and plan of care documented in the resident's note. 

## 2013-08-11 ENCOUNTER — Telehealth: Payer: Self-pay | Admitting: *Deleted

## 2013-08-11 ENCOUNTER — Telehealth: Payer: Self-pay | Admitting: Internal Medicine

## 2013-08-11 DIAGNOSIS — M1990 Unspecified osteoarthritis, unspecified site: Secondary | ICD-10-CM

## 2013-08-11 MED ORDER — NAPROXEN SODIUM 220 MG PO CAPS: 1.0000 | ORAL_CAPSULE | Freq: Every day | ORAL | Status: DC | PRN

## 2013-08-11 NOTE — Telephone Encounter (Signed)
Pt called and wanted to know if physician would write celebrex rx for his arthritis pain. Requests call from pcp to discuss this further.Despina Hidden Cassady3/27/20154:53 PM

## 2013-08-11 NOTE — Telephone Encounter (Signed)
No problem. I called the patient and answered his question.   Thanks!  SK

## 2013-08-11 NOTE — Telephone Encounter (Signed)
Pt had called to ask about prescription of Celebrex. He states that he was taking this medication before for his arthritis.  I explained to him that Celebrex carries a warning for increased risk of cardiovascular events including stroke and MI.  Given his history of chest pain/heart palpitations, he agreed that this may not be a good medication for him.  He will start taking Aleve once or twice per day PRN for his arthritis pain but with close monitoring for melena, hematochezia, and emesis. He will continue taking Prilosec for GERD. He will need close monitoring of his Cr with repeat BMET during his next visit as Aleve is contraindicated for CrCl <69ml/minute.   He verbalized understanding and agreed with this plan.

## 2013-08-14 ENCOUNTER — Encounter: Payer: Self-pay | Admitting: Internal Medicine

## 2013-08-14 ENCOUNTER — Ambulatory Visit (INDEPENDENT_AMBULATORY_CARE_PROVIDER_SITE_OTHER): Payer: Medicare Other | Admitting: Internal Medicine

## 2013-08-14 VITALS — BP 137/84 | HR 62 | Temp 97.0°F | Wt 193.0 lb

## 2013-08-14 DIAGNOSIS — Z0000 Encounter for general adult medical examination without abnormal findings: Secondary | ICD-10-CM

## 2013-08-14 DIAGNOSIS — E785 Hyperlipidemia, unspecified: Secondary | ICD-10-CM

## 2013-08-14 DIAGNOSIS — F1010 Alcohol abuse, uncomplicated: Secondary | ICD-10-CM

## 2013-08-14 DIAGNOSIS — M1990 Unspecified osteoarthritis, unspecified site: Secondary | ICD-10-CM

## 2013-08-14 DIAGNOSIS — N183 Chronic kidney disease, stage 3 unspecified: Secondary | ICD-10-CM

## 2013-08-14 DIAGNOSIS — I129 Hypertensive chronic kidney disease with stage 1 through stage 4 chronic kidney disease, or unspecified chronic kidney disease: Secondary | ICD-10-CM

## 2013-08-14 DIAGNOSIS — F17200 Nicotine dependence, unspecified, uncomplicated: Secondary | ICD-10-CM

## 2013-08-14 DIAGNOSIS — N189 Chronic kidney disease, unspecified: Secondary | ICD-10-CM

## 2013-08-14 DIAGNOSIS — I10 Essential (primary) hypertension: Secondary | ICD-10-CM

## 2013-08-14 MED ORDER — LOVASTATIN 20 MG PO TABS: 20.0000 mg | ORAL_TABLET | Freq: Every day | ORAL | Status: DC

## 2013-08-14 NOTE — Patient Instructions (Signed)
General Instructions: -Your blood pressure is well controlled today. Continue taking the lisinopril and the amlodipine for your blood pressure.  -Continue trying to quit smoking. Let us know if your want to try Chantix again.  -Follow up in 3 months for your blood pressure and kidney function.     Treatment Goals:  Goals (1 Years of Data) as of 08/14/13         As of Today 07/31/13 07/31/13 04/24/13 04/10/13     Blood Pressure    . Blood Pressure < 140/90  137/84 145/94 148/101 140/92 142/91     Lifestyle    . Quit smoking / using tobacco            Progress Toward Treatment Goals:  Treatment Goal 08/14/2013  Blood pressure at goal  Stop smoking smoking the same amount    Self Care Goals & Plans:  Self Care Goal 08/14/2013  Manage my medications refill my medications on time; bring my medications to every visit; take my medicines as prescribed  Monitor my health -  Eat healthy foods eat foods that are low in salt; eat baked foods instead of fried foods  Be physically active -  Stop smoking call [facility] (1-800-QUIT-NOW); set a quit date and stop smoking; cut down the number of cigarettes smoked  Meeting treatment goals maintain the current self-care plan    No flowsheet data found.   Care Management & Community Referrals:  Referral 03/27/2013  Referrals made for care management support none needed

## 2013-08-16 DIAGNOSIS — E785 Hyperlipidemia, unspecified: Secondary | ICD-10-CM | POA: Insufficient documentation

## 2013-08-16 NOTE — Assessment & Plan Note (Signed)
BP Readings from Last 3 Encounters:  08/14/13 137/84  07/31/13 145/94  04/24/13 140/92    Lab Results  Component Value Date   NA 140 03/27/2013   K 4.3 03/27/2013   CREATININE 1.36* 03/27/2013    Assessment: Blood pressure control: controlled Progress toward BP goal:  at goal Comments: On newly started Norvasc 5mg  daily, lisinopril 40mg  daily, HCTZ 25mg  daily, and propranolol 40mg  daily  Plan: Medications:  continue current medications,  Lisinopril 40 and HCTZ 25 currently not available at [facility]. He states that he is able to afford his current medications at [facility].  Educational resources provided:   Self management tools provided:   Other plans: Follow up in 3 months.

## 2013-08-16 NOTE — Assessment & Plan Note (Addendum)
Las lipid panel in November 2014 with t chol 202, HD of 33, trig to 290, LDL 111. His CVD risk is 28% in 10-yr which is low and may be reduced with lifestyle modifications or low dose statin. Pt not interested in abstaining from [facility] at this time, current smoker, states that he eats a heart healthy diet already.   -LFTs from 11/14 wnl -Will start lovastatin 20mg  daily (on the $4 list at [facility])

## 2013-08-16 NOTE — Assessment & Plan Note (Addendum)
Last labs in November 2014 with Cr of 1.34 (appears to be new baseline).  UA from 07/31/13 with no proteinuria and normal urine albumin/Cr. Pt on chronic Aleve use for his OA.  Pt understands that he needs close monitoring of his renal function but declined labs during this visit. He is agreeable to labs during his next visit.  Will order Vitamin D25, iPTH, CMP, phosphorus.

## 2013-08-16 NOTE — Assessment & Plan Note (Signed)
Declined vaccination again

## 2013-08-16 NOTE — Assessment & Plan Note (Signed)
  Assessment: Progress toward smoking cessation:  smoking the same amount Barriers to progress toward smoking cessation:  none Comments: Smoking the same, gave up on Chantix. Will try nicotine gum.  Plan: Instruction/counseling given:  I counseled patient on the dangers of tobacco use, advised patient to stop smoking, and reviewed strategies to maximize success. Educational resources provided:  [facility] Insurance account manager) brochure Self management tools provided:    Medications to assist with smoking cessation:  Nicotine Gum Patient agreed to the following self-care plans for smoking cessation: call [facility] (1-800-QUIT-NOW);set a quit date and stop smoking;cut down the number of cigarettes smoked  Other plans: Continue counseling on smoking cessation

## 2013-08-16 NOTE — Progress Notes (Signed)
   Subjective:    Patient ID: Michael Day, male    DOB: October 29, 1945, 68 y.o.   MRN: [id number]  Hypertension Pertinent negatives include no chest pain, headaches, palpitations or shortness of breath.   Michael Day is a 68 yr old man with PMH of tobacco use, alcohol abuse, osteoarthritis of multiple joints, CKD stage 3, and HTN who presents for follow up visit for his blood pressure. He was started on Norvasc 5mg  daily during his last visit as well as thiamine and folate and assures compliance with all these medications. He continues to take Aleeve, 1-2 tablets daily as needed for his osteoarthritis which is worse in his neck and right hand recently.  He continues to smoke cigarettes and drinks the same amount of alcohol since his last visit.   Review of Systems  Constitutional: Negative for fever, chills, activity change, appetite change and fatigue.  Respiratory: Negative for cough, shortness of breath and wheezing.   Cardiovascular: Negative for chest pain, palpitations and leg swelling.  Gastrointestinal: Negative for abdominal pain.  Genitourinary: Negative for dysuria and frequency.  Musculoskeletal: Positive for arthralgias and joint swelling.       Has had occasional swelling of the PIP joints of his right hand.   Skin: Negative for color change, pallor, rash and wound.  Neurological: Positive for tremors. Negative for dizziness, light-headedness and headaches.  Psychiatric/Behavioral: Negative for agitation.       Objective:   Physical Exam  Nursing note and vitals reviewed. Constitutional: He is oriented to person, place, and time. No distress.  Wearing sunglasses   Pulmonary/Chest: Effort normal. No respiratory distress. He has no wheezes. He has no rales.  Musculoskeletal:  Crepitus of neck with side to side rotation  Neurological: He is alert and oriented to person, place, and time.  Postural tremor of bilateral hands, not present at rest, worse with intentional  movement.   Skin: Skin is warm and dry. No rash noted. He is not diaphoretic. No erythema.  Psychiatric: He has a normal mood and affect.          Assessment & Plan:

## 2013-08-16 NOTE — Assessment & Plan Note (Addendum)
Continues to drink the same. Denies hx of alcohol withdrawal seizures. States that his tremor is always present and does not improved with alcohol consumption.  Not interested in AA or in talking to the [facility] CSW for other community resources for Rehab. -Pt compliant to thiamine and folate

## 2013-08-16 NOTE — Assessment & Plan Note (Addendum)
His pain in well controlled with Aleeve. (Of note, no radiologic confirmation of OA, will gather further hx during his next visit and consider imaging/labs as indicated)  Given his CKD stage 3, he is advised to avoid other NSAIDs and will need close lab monitoring of his lab function as Aleve is contraindicated for Cr/Cl <86ml/min.  He understands the need for close monitoring but declined any lab draws during this visit as he is "afraid of needles".  Will not start Celebrex given its associated increased risk of CV events.  The patient agrees to have labs drawn during this next visit.

## 2013-08-21 NOTE — Progress Notes (Signed)
Case discussed with Dr. Kennerly soon after the resident saw the patient.  We reviewed the resident's history and exam and pertinent patient test results.  I agree with the assessment, diagnosis, and plan of care documented in the resident's note. 

## 2013-10-17 ENCOUNTER — Other Ambulatory Visit: Payer: Self-pay | Admitting: *Deleted

## 2013-10-17 DIAGNOSIS — I10 Essential (primary) hypertension: Secondary | ICD-10-CM

## 2013-10-17 MED ORDER — HYDROCHLOROTHIAZIDE 25 MG PO TABS: 25.0000 mg | ORAL_TABLET | Freq: Every day | ORAL | Status: DC

## 2013-10-17 MED ORDER — PROPRANOLOL HCL 40 MG PO TABS: 40.0000 mg | ORAL_TABLET | Freq: Two times a day (BID) | ORAL | Status: DC

## 2013-10-24 ENCOUNTER — Telehealth: Payer: Self-pay | Admitting: Internal Medicine

## 2013-10-24 ENCOUNTER — Other Ambulatory Visit: Payer: Self-pay | Admitting: *Deleted

## 2013-10-24 DIAGNOSIS — I10 Essential (primary) hypertension: Secondary | ICD-10-CM

## 2013-10-24 MED ORDER — LISINOPRIL 40 MG PO TABS: 40.0000 mg | ORAL_TABLET | Freq: Every day | ORAL | Status: DC

## 2013-10-24 NOTE — Telephone Encounter (Signed)
Received a page on the on call pager from the patient who demanded that his Rx for Lisinopril be filled today as he had only one pill left.  Epic in basket message had been sent to me today at 4:08PM which was approved #90 tablets, 3 refills. He stated that he had called the clinic but nobody had answered his call.   I explained to the patient that he needs to allow for at 2 business days for prescription requests to be filled at his pharmacy. He agreed to call sooner next time. He will call his pharmacy and check to see when his prescription is ready for pick up.

## 2013-10-24 NOTE — Telephone Encounter (Signed)
Requesting 90 day supply. Thanks 

## 2014-01-12 ENCOUNTER — Encounter: Payer: Self-pay | Admitting: Cardiology

## 2014-01-12 ENCOUNTER — Ambulatory Visit (INDEPENDENT_AMBULATORY_CARE_PROVIDER_SITE_OTHER): Payer: Medicare Other | Admitting: Cardiology

## 2014-01-12 VITALS — BP 132/86 | HR 59 | Ht 68.5 in | Wt 191.0 lb

## 2014-01-12 DIAGNOSIS — I10 Essential (primary) hypertension: Secondary | ICD-10-CM

## 2014-01-12 DIAGNOSIS — R079 Chest pain, unspecified: Secondary | ICD-10-CM

## 2014-01-12 DIAGNOSIS — F17200 Nicotine dependence, unspecified, uncomplicated: Secondary | ICD-10-CM

## 2014-01-12 DIAGNOSIS — E785 Hyperlipidemia, unspecified: Secondary | ICD-10-CM

## 2014-01-12 NOTE — Assessment & Plan Note (Signed)
Down to half a pack a day. We discussed the importance of stopping for now I've asked him to put his cigarettes in his car he said he can't smoke in the house he actually has to go to the car to smoke. The cyst to break the habit of snacking and then he will think of the best 5 cigarettes he has in a day and try to cutback to 5 cigarettes.

## 2014-01-12 NOTE — Assessment & Plan Note (Signed)
Pain comes and goes no associated symptoms somewhat atypical.  He stated he could not do stress test on treadmill so ordered a lift in my view to evaluate for ischemia his last test was almost 2 years ago.

## 2014-01-12 NOTE — Assessment & Plan Note (Signed)
Lipid Panel     Component Value Date/Time   CHOL 202* 03/27/2013 1405   TRIG 290* 03/27/2013 1405   HDL 33* 03/27/2013 1405   CHOLHDL 6.1 03/27/2013 1405   VLDL 58* 03/27/2013 1405   LDLCALC 111* 03/27/2013 1405

## 2014-01-12 NOTE — Progress Notes (Signed)
01/12/2014   PCP: Blain Pais, MD   Chief Complaint  Patient presents with  . Chest Pain    could not wait for regular appt     Primary Cardiologist: Dr. Johnsie Cancel  HPI:  68 year old male with hx significant for htn, tobacco abuse and prior spontaneous pneumothorax 2 years ago with subsequent left chest tube placement who presents for evaluation of "irregular heart beats" and chest pain today. Pt states that for the last couple of days to weeks he has chest pain tightness, grabbing in his heart.  No associated symptoms of nausea, SOB, or diaphoresis.  He also complains of tightness across his shoulder blades at times.  This does not occur with his chest pain.  He is also having what sound like skipped beats.  These occur a couple times of month.  He did not wish to wait until Dec. For his regular appt.   He continues to smoke ~half ppd of cigarettes. It has been almost 2 years since stress test.  He does not want to walk on treadmill, does not believe he can.  He has decreased alcohol intake due to finances.   No Known Allergies  Current Outpatient Prescriptions  Medication Sig Dispense Refill  . amLODipine (NORVASC) 5 MG tablet Take 1 tablet (5 mg total) by mouth daily.  30 tablet  11  . folic acid (FOLVITE) 1 MG tablet Take 1 tablet (1 mg total) by mouth daily.  30 tablet  11  . hydrochlorothiazide (HYDRODIURIL) 25 MG tablet Take 1 tablet (25 mg total) by mouth daily.  30 tablet  6  . lisinopril (PRINIVIL,ZESTRIL) 40 MG tablet Take 1 tablet (40 mg total) by mouth daily.  90 tablet  3  . omeprazole (PRILOSEC) 20 MG capsule Take 1 capsule (20 mg total) by mouth daily.  30 capsule  11  . propranolol (INDERAL) 40 MG tablet Take 1 tablet (40 mg total) by mouth 2 (two) times daily. TAKE ONE TABLET BY MOUTH TWICE DAILY  60 tablet  6  . thiamine 100 MG tablet Take 1 tablet (100 mg total) by mouth daily.  30 tablet  11   No current facility-administered medications for  this visit.    Past Medical History  Diagnosis Date  . Hypertension   . Arthritis   . Adenomatous polyp of colon 2005    3 polyps removal per colonoscopy  . Tobacco dependence   . Tremor, essential   . Alcohol use   . Chest pain     Past Surgical History  Procedure Laterality Date  . Colonoscopy      hx of polyp  . Colonoscopy  08/19/2011    Procedure: COLONOSCOPY;  Surgeon: Lear Ng, MD;  Location: [facility];  Service: Endoscopy;  Laterality: N/A;    OIZ:TIWPYKD:XI colds or fevers, no weight changes Skin:no rashes or ulcers HEENT:no blurred vision, no congestion CV:see HPI PUL:see HPI GI:no diarrhea constipation or melena, no indigestion GU:no hematuria, no dysuria MS:no joint pain, no claudication Neuro:no syncope, no lightheadedness Endo:no diabetes, no thyroid disease  Wt Readings from Last 3 Encounters:  01/12/14 191 lb (86.637 kg)  08/14/13 193 lb (87.544 kg)  07/31/13 192 lb 8 oz (87.317 kg)    PHYSICAL EXAM BP 132/86  Pulse 59  Ht 5' 8.5" (1.74 m)  Wt 191 lb (86.637 kg)  BMI 28.62 kg/m2 General:Pleasant affect, NAD Skin:Warm and dry, brisk capillary refill HEENT:normocephalic, sclera clear,  mucus membranes moist Neck:supple, no JVD, no bruits  Heart:S1S2 RRR without murmur, gallup, rub or click Lungs:clear without rales, rhonchi, or wheezes XBJ:YNWG, non tender, + BS, do not palpate liver spleen or masses Ext:no lower ext edema, 2+ pedal pulses, 2+ radial pulses Neuro:alert and oriented, MAE, follows commands, + facial symmetry EKG:SB at 59, normal EKG, no acute changes from previous.  ASSESSMENT AND PLAN Chest pain at rest Pain comes and goes no associated symptoms somewhat atypical.  He stated he could not do stress test on treadmill so ordered a lift in my view to evaluate for ischemia his last test was almost 2 years ago.  Hyperlipidemia Lipid Panel     Component Value Date/Time   CHOL 202* 03/27/2013 1405   TRIG 290*  03/27/2013 1405   HDL 33* 03/27/2013 1405   CHOLHDL 6.1 03/27/2013 1405   VLDL 58* 03/27/2013 1405   LDLCALC 111* 03/27/2013 1405     Hypertension controlled  Tobacco dependence Down to half a pack a day. We discussed the importance of stopping for now I've asked him to put his cigarettes in his car he said he can't smoke in the house he actually has to go to the car to smoke. The cyst to break the habit of snacking and then he will think of the best 5 cigarettes he has in a day and try to cutback to 5 cigarettes.   He'll follow up with Dr. Johnsie Cancel in several weeks if the test is positive, we will call him the results

## 2014-01-12 NOTE — Assessment & Plan Note (Signed)
controlled 

## 2014-01-12 NOTE — Patient Instructions (Signed)
Your physician has requested that you have a lexiscan myoview. For further information please visit [email or web address]. Please follow instruction sheet, as given.  Your physician recommends that you continue on your current medications as directed. Please refer to the Current Medication list given to you today.  Your physician recommends that you schedule a follow-up appointment with Dr.Nishan after Stress Test

## 2014-01-15 ENCOUNTER — Encounter: Payer: Self-pay | Admitting: Cardiology

## 2014-01-16 HISTORY — PX: CARDIAC CATHETERIZATION: SHX172

## 2014-01-19 ENCOUNTER — Ambulatory Visit (HOSPITAL_COMMUNITY): Payer: Medicare Other | Attending: Cardiology | Admitting: Radiology

## 2014-01-19 VITALS — BP 125/74 | HR 55 | Ht 68.5 in | Wt 190.0 lb

## 2014-01-19 DIAGNOSIS — R079 Chest pain, unspecified: Secondary | ICD-10-CM | POA: Diagnosis not present

## 2014-01-19 DIAGNOSIS — I10 Essential (primary) hypertension: Secondary | ICD-10-CM | POA: Insufficient documentation

## 2014-01-19 MED ORDER — TECHNETIUM TC 99M SESTAMIBI GENERIC - CARDIOLITE
33.0000 | Freq: Once | INTRAVENOUS | Status: AC | PRN
  Administered 2014-01-19: 33 via INTRAVENOUS

## 2014-01-19 MED ORDER — REGADENOSON 0.4 MG/5ML IV SOLN
0.4000 mg | Freq: Once | INTRAVENOUS | Status: AC
  Administered 2014-01-19: 0.4 mg via INTRAVENOUS

## 2014-01-19 MED ORDER — TECHNETIUM TC 99M SESTAMIBI GENERIC - CARDIOLITE
11.0000 | Freq: Once | INTRAVENOUS | Status: AC | PRN
  Administered 2014-01-19: 11 via INTRAVENOUS

## 2014-01-19 NOTE — Progress Notes (Signed)
[facility] [address] [place],  [address] [phone]    Cardiology Nuclear Med Study  Michael Day is a 68 y.o. male     MRN : [id number]     DOB: 1945-07-27  Procedure Date: 01/19/2014  Nuclear Med Background Indication for Stress Test:  Evaluation for Ischemia History:  MPI 2014 (normal) EF 53% Cardiac Risk Factors: Hypertension  Symptoms:  Chest Pain (last date of chest discomfort was one week ago)   Nuclear Pre-Procedure Caffeine/Decaff Intake:  None NPO After: 7:00pm   Lungs:  clear O2 Sat: 96% on room air. IV 0.9% NS with Angio Cath:  22g  IV Site: R Hand  IV Started by:  Crissie Figures, RN  Chest Size (in):  46 Cup Size: n/a  Height: 5' 8.5" (1.74 m)  Weight:  190 lb (86.183 kg)  BMI:  Body mass index is 28.47 kg/(m^2). Tech Comments:  N/A    Nuclear Med Study 1 or 2 day study: 1 day  Stress Test Type:  Lexiscan  Reading MD: N/A  Order Authorizing Provider:  Jenkins Rouge, MD  Resting Radionuclide: Technetium 36m Sestamibi  Resting Radionuclide Dose: 11.0 mCi   Stress Radionuclide:  Technetium 74m Sestamibi  Stress Radionuclide Dose: 33.0 mCi           Stress Protocol Rest HR: 55 Stress HR: 86  Rest BP: 125/74 Stress BP: 131/79  Exercise Time (min): n/a METS: n/a           Dose of Adenosine (mg):  n/a Dose of Lexiscan: 0.4 mg  Dose of Atropine (mg): n/a Dose of Dobutamine: n/a mcg/kg/min (at max HR)  Stress Test Technologist: Glade Lloyd, BS-ES  Nuclear Technologist:  Annye Rusk, CNMT     Rest Procedure:  Myocardial perfusion imaging was performed at rest 45 minutes following the intravenous administration of Technetium 81m Sestamibi. Rest ECG: NSR - Normal EKG  Stress Procedure:  The patient received IV Lexiscan 0.4 mg over 15-seconds.  Technetium 3m Sestamibi injected at 30-seconds.  Quantitative spect images were obtained after a 45 minute delay.  During the infusion of Lexiscan the patient stated he felt a  "rush", SOB, lightheaded and a headache.  These symptoms began to resolve in recovery.  Stress ECG: No significant change from baseline ECG  QPS Raw Data Images:  Bowel artifact Stress Images:  There is decreased uptake in the inferior wall. Rest Images:  There is decreased uptake in the inferior wall. Subtraction (SDS):  Mixed ischemia/infarct Transient Ischemic Dilatation (Normal <1.22):  1.07 Lung/Heart Ratio (Normal <0.45):  0.26  Quantitative Gated Spect Images QGS EDV:  114 ml QGS ESV:  59 ml  Impression Exercise Capacity:  Lexiscan with no exercise. BP Response:  Normal blood pressure response. Clinical Symptoms:  There is dyspnea. ECG Impression:  No significant ST segment change suggestive of ischemia. 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  LV Ejection Fraction: 48%.  LV Wall Motion:  Normal Wall Motion or Mildly decreased function apical hypokinesis  Jenkins Rouge

## 2014-01-23 ENCOUNTER — Other Ambulatory Visit (INDEPENDENT_AMBULATORY_CARE_PROVIDER_SITE_OTHER): Payer: Medicare Other

## 2014-01-23 ENCOUNTER — Telehealth: Payer: Self-pay | Admitting: *Deleted

## 2014-01-23 ENCOUNTER — Other Ambulatory Visit: Payer: Self-pay | Admitting: Cardiovascular Disease

## 2014-01-23 ENCOUNTER — Encounter: Payer: Self-pay | Admitting: *Deleted

## 2014-01-23 ENCOUNTER — Telehealth: Payer: Self-pay | Admitting: Cardiovascular Disease

## 2014-01-23 DIAGNOSIS — Z01812 Encounter for preprocedural laboratory examination: Secondary | ICD-10-CM

## 2014-01-23 DIAGNOSIS — R079 Chest pain, unspecified: Secondary | ICD-10-CM

## 2014-01-23 LAB — CBC WITH DIFFERENTIAL/PLATELET
BASOS ABS: 0 10*3/uL (ref 0.0–0.1)
Basophils Relative: 0.4 % (ref 0.0–3.0)
Eosinophils Absolute: 0.2 10*3/uL (ref 0.0–0.7)
Eosinophils Relative: 3.6 % (ref 0.0–5.0)
HCT: 45.1 % (ref 39.0–52.0)
HEMOGLOBIN: 15.1 g/dL (ref 13.0–17.0)
LYMPHS PCT: 48.1 % — AB (ref 12.0–46.0)
Lymphs Abs: 2.8 10*3/uL (ref 0.7–4.0)
MCHC: 33.6 g/dL (ref 30.0–36.0)
MCV: 99.7 fl (ref 78.0–100.0)
MONOS PCT: 9.3 % (ref 3.0–12.0)
Monocytes Absolute: 0.5 10*3/uL (ref 0.1–1.0)
Neutro Abs: 2.3 10*3/uL (ref 1.4–7.7)
Neutrophils Relative %: 38.6 % — ABNORMAL LOW (ref 43.0–77.0)
PLATELETS: 172 10*3/uL (ref 150.0–400.0)
RBC: 4.52 Mil/uL (ref 4.22–5.81)
RDW: 14.2 % (ref 11.5–15.5)
WBC: 5.9 10*3/uL (ref 4.0–10.5)

## 2014-01-23 LAB — BASIC METABOLIC PANEL
BUN: 14 mg/dL (ref 6–23)
CHLORIDE: 106 meq/L (ref 96–112)
CO2: 28 meq/L (ref 19–32)
Calcium: 9 mg/dL (ref 8.4–10.5)
Creatinine, Ser: 1.5 mg/dL (ref 0.4–1.5)
GFR: 62.08 mL/min (ref >60.00)
Glucose, Bld: 90 mg/dL (ref 70–99)
Potassium: 3.8 mEq/L (ref 3.5–5.1)
SODIUM: 140 meq/L (ref 135–145)

## 2014-01-23 LAB — PROTIME-INR
INR: 1 ratio (ref 0.8–1.0)
PROTHROMBIN TIME: 11.5 s (ref 9.6–13.1)

## 2014-01-23 NOTE — Telephone Encounter (Signed)
PT CALLED BACK IS  WILLING  TO PROCEED WITH HEART  CATH  AS PLANNED  FOR  TOM WITH  DR Johnsie Cancel  AT  8:30 AM .Adonis Housekeeper

## 2014-01-23 NOTE — Telephone Encounter (Signed)
LEFT MESSAGE RE  HOLDING  LISINOPRIL AS  WELL AS  HCTZ   IN AM  ELEVATED  CR  PER  DR NISHAN./CY

## 2014-01-23 NOTE — Telephone Encounter (Signed)
New message     Pt is scheduled to have a cath tomorrow----talk to the nurse about the procedure.

## 2014-01-23 NOTE — Telephone Encounter (Signed)
PT  NOTIFIED ./CY 

## 2014-01-24 ENCOUNTER — Encounter (HOSPITAL_COMMUNITY)
Admission: RE | Disposition: A | Discharge status: Home / Self Care | Payer: Self-pay | Source: Ambulatory Visit | Attending: Cardiovascular Disease

## 2014-01-24 ENCOUNTER — Ambulatory Visit (HOSPITAL_COMMUNITY)
Admission: RE | Admit: 2014-01-24 | Discharge: 2014-01-24 | Disposition: A | Discharge status: Home / Self Care | Payer: Medicare Other | Source: Ambulatory Visit | Attending: Cardiovascular Disease | Admitting: Cardiovascular Disease

## 2014-01-24 DIAGNOSIS — R079 Chest pain, unspecified: Secondary | ICD-10-CM

## 2014-01-24 DIAGNOSIS — R0989 Other specified symptoms and signs involving the circulatory and respiratory systems: Secondary | ICD-10-CM | POA: Insufficient documentation

## 2014-01-24 DIAGNOSIS — R0609 Other forms of dyspnea: Secondary | ICD-10-CM | POA: Insufficient documentation

## 2014-01-24 DIAGNOSIS — I10 Essential (primary) hypertension: Secondary | ICD-10-CM | POA: Diagnosis not present

## 2014-01-24 DIAGNOSIS — I2510 Atherosclerotic heart disease of native coronary artery without angina pectoris: Secondary | ICD-10-CM

## 2014-01-24 HISTORY — PX: LEFT HEART CATHETERIZATION WITH CORONARY ANGIOGRAM: SHX5451

## 2014-01-24 SURGERY — LEFT HEART CATHETERIZATION WITH CORONARY ANGIOGRAM
Anesthesia: LOCAL

## 2014-01-24 MED ORDER — HEPARIN SODIUM (PORCINE) 1000 UNIT/ML IJ SOLN
INTRAMUSCULAR | Status: AC
  Filled 2014-01-24: qty 1

## 2014-01-24 MED ORDER — VERAPAMIL HCL 2.5 MG/ML IV SOLN
INTRAVENOUS | Status: DC
Start: 2014-01-24 — End: 2014-01-24
  Filled 2014-01-24: qty 2

## 2014-01-24 MED ORDER — FENTANYL CITRATE 0.05 MG/ML IJ SOLN
INTRAMUSCULAR | Status: AC
  Filled 2014-01-24: qty 2

## 2014-01-24 MED ORDER — NITROGLYCERIN 1 MG/10 ML FOR IR/CATH LAB
INTRA_ARTERIAL | Status: AC
  Filled 2014-01-24: qty 10

## 2014-01-24 MED ORDER — LIDOCAINE HCL (PF) 1 % IJ SOLN
INTRAMUSCULAR | Status: AC
  Filled 2014-01-24: qty 30

## 2014-01-24 MED ORDER — SODIUM CHLORIDE 0.9 % IV SOLN: 250.0000 mL | INTRAVENOUS | Status: DC | PRN

## 2014-01-24 MED ORDER — SODIUM CHLORIDE 0.9 % IV SOLN
INTRAVENOUS | Status: DC
  Administered 2014-01-24: 07:00:00 via INTRAVENOUS

## 2014-01-24 MED ORDER — SODIUM CHLORIDE 0.9 % IJ SOLN: 3.0000 mL | INTRAMUSCULAR | Status: DC | PRN

## 2014-01-24 MED ORDER — SODIUM CHLORIDE 0.45 % IV SOLN
INTRAVENOUS | Status: AC
  Administered 2014-01-24: 09:00:00 via INTRAVENOUS

## 2014-01-24 MED ORDER — VERAPAMIL HCL 2.5 MG/ML IV SOLN
INTRAVENOUS | Status: AC
  Filled 2014-01-24: qty 2

## 2014-01-24 MED ORDER — SODIUM CHLORIDE 0.9 % IJ SOLN
3.0000 mL | Freq: Two times a day (BID) | INTRAMUSCULAR | Status: DC
  Administered 2014-01-24: 3 mL via INTRAVENOUS

## 2014-01-24 MED ORDER — HEPARIN (PORCINE) IN NACL 2-0.9 UNIT/ML-% IJ SOLN
INTRAMUSCULAR | Status: AC
  Filled 2014-01-24: qty 1500

## 2014-01-24 MED ORDER — MIDAZOLAM HCL 2 MG/2ML IJ SOLN
INTRAMUSCULAR | Status: AC
  Filled 2014-01-24: qty 2

## 2014-01-24 MED ORDER — DIAZEPAM 5 MG PO TABS: 5.0000 mg | ORAL_TABLET | ORAL | Status: DC | PRN

## 2014-01-24 MED ORDER — ASPIRIN 81 MG PO CHEW
81.0000 mg | CHEWABLE_TABLET | ORAL | Status: AC
  Administered 2014-01-24: 81 mg via ORAL

## 2014-01-24 MED ORDER — ASPIRIN 81 MG PO CHEW
CHEWABLE_TABLET | ORAL | Status: DC
Start: 2014-01-24 — End: 2014-01-24
  Filled 2014-01-24: qty 1

## 2014-01-24 MED ORDER — OXYCODONE-ACETAMINOPHEN 5-325 MG PO TABS: 1.0000 | ORAL_TABLET | ORAL | Status: DC | PRN

## 2014-01-24 NOTE — Interval H&P Note (Signed)
History and Physical Interval Note:  01/24/2014 8:03 AM  Michael Day  has presented today for surgery, with the diagnosis of cp  The various methods of treatment have been discussed with the patient and family. After consideration of risks, benefits and other options for treatment, the patient has consented to  Procedure(s): LEFT HEART CATHETERIZATION WITH CORONARY ANGIOGRAM (N/A) as a surgical intervention .  The patient's history has been reviewed, patient examined, no change in status, stable for surgery.  I have reviewed the patient's chart and labs.  Questions were answered to the patient's satisfaction.     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

## 2014-01-24 NOTE — H&P (View-Only) (Signed)
[facility] [address] [place], [place] [address] [phone]    Cardiology Nuclear Med Study  Michael Day is a 68 y.o. male     MRN : [id number]     DOB: 03/07/1946  Procedure Date: 01/19/2014  Nuclear Med Background Indication for Stress Test:  Evaluation for Ischemia History:  MPI 2014 (normal) EF 53% Cardiac Risk Factors: Hypertension  Symptoms:  Chest Pain (last date of chest discomfort was one week ago)   Nuclear Pre-Procedure Caffeine/Decaff Intake:  None NPO After: 7:00pm   Lungs:  clear O2 Sat: 96% on room air. IV 0.9% NS with Angio Cath:  22g  IV Site: R Hand  IV Started by:  Crissie Figures, RN  Chest Size (in):  46 Cup Size: n/a  Height: 5' 8.5" (1.74 m)  Weight:  190 lb (86.183 kg)  BMI:  Body mass index is 28.47 kg/(m^2). Tech Comments:  N/A    Nuclear Med Study 1 or 2 day study: 1 day  Stress Test Type:  Lexiscan  Reading MD: N/A  Order Authorizing Provider:  Jenkins Rouge, MD  Resting Radionuclide: Technetium 81m Sestamibi  Resting Radionuclide Dose: 11.0 mCi   Stress Radionuclide:  Technetium 65m Sestamibi  Stress Radionuclide Dose: 33.0 mCi           Stress Protocol Rest HR: 55 Stress HR: 86  Rest BP: 125/74 Stress BP: 131/79  Exercise Time (min): n/a METS: n/a           Dose of Adenosine (mg):  n/a Dose of Lexiscan: 0.4 mg  Dose of Atropine (mg): n/a Dose of Dobutamine: n/a mcg/kg/min (at max HR)  Stress Test Technologist: Glade Lloyd, BS-ES  Nuclear Technologist:  Annye Rusk, CNMT     Rest Procedure:  Myocardial perfusion imaging was performed at rest 45 minutes following the intravenous administration of Technetium 71m Sestamibi. Rest ECG: NSR - Normal EKG  Stress Procedure:  The patient received IV Lexiscan 0.4 mg over 15-seconds.  Technetium 52m Sestamibi injected at 30-seconds.  Quantitative spect images were obtained after a 45 minute delay.  During the infusion of Lexiscan the patient stated he felt a  "rush", SOB, lightheaded and a headache.  These symptoms began to resolve in recovery.  Stress ECG: No significant change from baseline ECG  QPS Raw Data Images:  Bowel artifact Stress Images:  There is decreased uptake in the inferior wall. Rest Images:  There is decreased uptake in the inferior wall. Subtraction (SDS):  Mixed ischemia/infarct Transient Ischemic Dilatation (Normal <1.22):  1.07 Lung/Heart Ratio (Normal <0.45):  0.26  Quantitative Gated Spect Images QGS EDV:  114 ml QGS ESV:  59 ml  Impression Exercise Capacity:  Lexiscan with no exercise. BP Response:  Normal blood pressure response. Clinical Symptoms:  There is dyspnea. ECG Impression:  No significant ST segment change suggestive of ischemia. 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  LV Ejection Fraction: 48%.  LV Wall Motion:  Normal Wall Motion or Mildly decreased function apical hypokinesis  Jenkins Rouge

## 2014-01-24 NOTE — CV Procedure (Signed)
    Cardiac Catheterization Procedure Note  Name: Martice Doty MRN: [id number] DOB: 17-Feb-1946  Procedure: Left Heart Cath, Selective Coronary Angiography, LV angiography  Indication:  Chest pain abnormal myovue   Procedural Details: The right wrist was prepped, draped, and anesthetized with 1% lidocaine. Using the modified Seldinger technique, a 5/6 French Slender sheath was introduced into the right radial artery. 3 mg of verapamil was administered through the sheath, weight-based unfractionated heparin was administered intravenously. Standard Judkins catheters were used for selective coronary angiography and left ventriculography. Catheter exchanges were performed over an exchange length guidewire. There were no immediate procedural complications. A TR band was used for radial hemostasis at the completion of the procedure.  The patient was transferred to the post catheterization recovery area for further monitoring.  Procedural Findings: Hemodynamics: AO 115 72 LV 115 8  Coronary angiography: Coronary dominance: right  Left mainstem: Normal  Left anterior descending (LAD):  Large vessel wraps apex  Normal proximal and mid  Diffuse 70 disease distally as it wraps apex   D1: normal  D2: normal  D3 normal  Left circumflex (LCx): Normal   OM1: large branching artery normal  AV groove large branching vessel normal  Right coronary artery (RCA):   Small ? Codominant  Normal Most of PDA supplied by wrap around LAD  Left ventriculography: Left ventricular systolic function is normal, LVEF is estimated at 55-65%, there is no significant mitral regurgitation       Recommendations:  Distal LAD disease best Rx medically consider changing norvasc to long acting nitro.  Continue beta blocker  F/U with me in office   Jenkins Rouge MD, [facility] 01/24/2014, 8:55 AM

## 2014-01-24 NOTE — Discharge Instructions (Addendum)
Call if any right wrist pain F/U Dr Johnsie Cancel Office will call  Radial Site Care  Refer to this sheet in the next few weeks. These instructions provide you with information on caring for yourself after your procedure. Your caregiver may also give you more specific instructions. Your treatment has been planned according to current medical practices, but problems sometimes occur. Call your caregiver if you have any problems or questions after your procedure. HOME CARE INSTRUCTIONS  You may shower the day after the procedure.Remove the bandage (dressing) and gently wash the site with plain soap and water.Gently pat the site dry.  Do not apply powder or lotion to the site.  Do not submerge the affected site in water for 3 to 5 days.  Inspect the site at least twice daily.  Do not flex or bend the affected arm for 24 hours.  No lifting over 5 pounds (2.3 kg) for 5 days after your procedure.  Do not drive home if you are discharged the same day of the procedure. Have someone else drive you.  You may drive 24 hours after the procedure unless otherwise instructed by your caregiver.  Do not operate machinery or power tools for 24 hours.  A responsible adult should be with you for the first 24 hours after you arrive home. What to expect:  Any bruising will usually fade within 1 to 2 weeks.  Blood that collects in the tissue (hematoma) may be painful to the touch. It should usually decrease in size and tenderness within 1 to 2 weeks. SEEK IMMEDIATE MEDICAL CARE IF:  You have unusual pain at the radial site.  You have redness, warmth, swelling, or pain at the radial site.  You have drainage (other than a small amount of blood on the dressing).  You have chills.  You have a fever or persistent symptoms for more than 72 hours.  You have a fever and your symptoms suddenly get worse.  Your arm becomes pale, cool, tingly, or numb.  You have heavy bleeding from the site. Hold pressure on  the site. Document Released: 06/06/2010 Document Revised: 07/27/2011 Document Reviewed: 06/06/2010 [facility] Patient Information 2015 [place], [place]. This information is not intended to replace advice given to you by your health care provider. Make sure you discuss any questions you have with your health care provider.

## 2014-01-25 ENCOUNTER — Telehealth: Payer: Self-pay | Admitting: Cardiovascular Disease

## 2014-01-25 NOTE — Telephone Encounter (Signed)
New message      Pt saw Dr Johnsie Cancel yesterday at the hospital. He said someone is supposed to call him today regarding his medications.  Please call

## 2014-01-25 NOTE — Telephone Encounter (Signed)
Called patient back. Was discharged yesterday by Dr. Johnsie Cancel after heart catheterization. Patient asking if his meds are going to be changed. Will send message to [facility] for further med orders.

## 2014-01-26 MED ORDER — ISOSORBIDE MONONITRATE ER 30 MG PO TB24: 15.0000 mg | ORAL_TABLET | Freq: Every day | ORAL | Status: DC

## 2014-01-26 MED ORDER — LISINOPRIL 20 MG PO TABS: 20.0000 mg | ORAL_TABLET | Freq: Every day | ORAL | Status: AC

## 2014-01-26 NOTE — Telephone Encounter (Signed)
Pt notified. Will send new prescriptions to [place] at [facility]. Pt requests call from Dr. Johnsie Cancel to discuss his condition

## 2014-01-26 NOTE — Telephone Encounter (Signed)
Decrease lisinopril to 20 mg daily Call in new script for imdur 15 mg daily

## 2014-01-30 ENCOUNTER — Telehealth: Payer: Self-pay | Admitting: Cardiovascular Disease

## 2014-01-30 NOTE — Telephone Encounter (Signed)
Spoke w/pt. He was wondering what the results were of his cath. Reviewed results and his medications.  Tried to answer questions. Seemed to be satisfied with discussion.  Has an appointment 9/29 with Dr. Johnsie Cancel.  Advised Dr. Johnsie Cancel will further discuss the results and answer any questions he might have.  He will call if has any further questions.

## 2014-01-30 NOTE — Telephone Encounter (Signed)
°  Patient would like to speak with Dr. Johnsie Cancel. He has some questions, he would like answered. Please call back.

## 2014-02-01 ENCOUNTER — Telehealth: Payer: Self-pay | Admitting: *Deleted

## 2014-02-01 NOTE — Telephone Encounter (Signed)
Pt called - past week has clear productive cough and wheezing in chest. No appt in clinic today. Suggest ER or Urgent Care. Appt made 02/02/14 3:45pm Dr Denton Brick. Pt states he has no money. Hilda Blades Mattson Dayal RN 02/01/14 2:20PM

## 2014-02-02 ENCOUNTER — Ambulatory Visit (INDEPENDENT_AMBULATORY_CARE_PROVIDER_SITE_OTHER): Payer: Medicare Other | Admitting: Internal Medicine

## 2014-02-02 ENCOUNTER — Encounter: Payer: Self-pay | Admitting: Internal Medicine

## 2014-02-02 VITALS — BP 135/79 | HR 66 | Temp 98.7°F | Ht 68.5 in | Wt 191.7 lb

## 2014-02-02 DIAGNOSIS — J069 Acute upper respiratory infection, unspecified: Secondary | ICD-10-CM

## 2014-02-02 MED ORDER — GUAIFENESIN 200 MG/5ML PO LIQD: 400.0000 mg | ORAL | Status: DC | PRN

## 2014-02-02 NOTE — Patient Instructions (Addendum)
General Instructions:   If you are not getting better in 2 weeks come back to the clinic we will check you rlungs for an infection, probably do a chest xray.    Please bring your medicines with you each time you come to clinic.  Medicines may include prescription medications, over-the-counter medications, herbal remedies, eye drops, vitamins, or other pills.  Upper Respiratory Infection, Adult An upper respiratory infection (URI) is also sometimes known as the common cold. The upper respiratory tract includes the nose, sinuses, throat, trachea, and bronchi. Bronchi are the airways leading to the lungs. Most people improve within 1 week, but symptoms can last up to 2 weeks. A residual cough may last even longer.  CAUSES Many different viruses can infect the tissues lining the upper respiratory tract. The tissues become irritated and inflamed and often become very moist. Mucus production is also common. A cold is contagious. You can easily spread the virus to others by oral contact. This includes kissing, sharing a glass, coughing, or sneezing. Touching your mouth or nose and then touching a surface, which is then touched by another person, can also spread the virus. SYMPTOMS  Symptoms typically develop 1 to 3 days after you come in contact with a cold virus. Symptoms vary from person to person. They may include:  Runny nose.  Sneezing.  Nasal congestion.  Sinus irritation.  Sore throat.  Loss of voice (laryngitis).  Cough.  Fatigue.  Muscle aches.  Loss of appetite.  Headache.  Low-grade fever. DIAGNOSIS  You might diagnose your own cold based on familiar symptoms, since most people get a cold 2 to 3 times a year. Your caregiver can confirm this based on your exam. Most importantly, your caregiver can check that your symptoms are not due to another disease such as strep throat, sinusitis, pneumonia, asthma, or epiglottitis. Blood tests, throat tests, and X-rays are not necessary  to diagnose a common cold, but they may sometimes be helpful in excluding other more serious diseases. Your caregiver will decide if any further tests are required. RISKS AND COMPLICATIONS  You may be at risk for a more severe case of the common cold if you smoke cigarettes, have chronic heart disease (such as heart failure) or lung disease (such as asthma), or if you have a weakened immune system. The very young and very old are also at risk for more serious infections. Bacterial sinusitis, middle ear infections, and bacterial pneumonia can complicate the common cold. The common cold can worsen asthma and chronic obstructive pulmonary disease (COPD). Sometimes, these complications can require emergency medical care and may be life-threatening. PREVENTION  The best way to protect against getting a cold is to practice good hygiene. Avoid oral or hand contact with people with cold symptoms. Wash your hands often if contact occurs. There is no clear evidence that vitamin C, vitamin E, echinacea, or exercise reduces the chance of developing a cold. However, it is always recommended to get plenty of rest and practice good nutrition. TREATMENT  Treatment is directed at relieving symptoms. There is no cure. Antibiotics are not effective, because the infection is caused by a virus, not by bacteria. Treatment may include:  Increased fluid intake. Sports drinks offer valuable electrolytes, sugars, and fluids.  Breathing heated mist or steam (vaporizer or shower).  Eating chicken soup or other clear broths, and maintaining good nutrition.  Getting plenty of rest.  Using gargles or lozenges for comfort.  Controlling fevers with ibuprofen or acetaminophen as directed by  your caregiver.  Increasing usage of your inhaler if you have asthma. Zinc gel and zinc lozenges, taken in the first 24 hours of the common cold, can shorten the duration and lessen the severity of symptoms. Pain medicines may help with  fever, muscle aches, and throat pain. A variety of non-prescription medicines are available to treat congestion and runny nose. Your caregiver can make recommendations and may suggest nasal or lung inhalers for other symptoms.  HOME CARE INSTRUCTIONS   Only take over-the-counter or prescription medicines for pain, discomfort, or fever as directed by your caregiver.  Use a warm mist humidifier or inhale steam from a shower to increase air moisture. This may keep secretions moist and make it easier to breathe.  Drink enough water and fluids to keep your urine clear or pale yellow.  Rest as needed.  Return to work when your temperature has returned to normal or as your caregiver advises. You may need to stay home longer to avoid infecting others. You can also use a face mask and careful hand washing to prevent spread of the virus. SEEK MEDICAL CARE IF:   After the first few days, you feel you are getting worse rather than better.  You need your caregiver's advice about medicines to control symptoms.  You develop chills, worsening shortness of breath, or brown or red sputum. These may be signs of pneumonia.  You develop yellow or brown nasal discharge or pain in the face, especially when you bend forward. These may be signs of sinusitis.  You develop a fever, swollen neck glands, pain with swallowing, or white areas in the back of your throat. These may be signs of strep throat. SEEK IMMEDIATE MEDICAL CARE IF:   You have a fever.  You develop severe or persistent headache, ear pain, sinus pain, or chest pain.  You develop wheezing, a prolonged cough, cough up blood, or have a change in your usual mucus (if you have chronic lung disease).  You develop sore muscles or a stiff neck. Document Released: 10/28/2000 Document Revised: 07/27/2011 Document Reviewed: 08/09/2013 [facility] Patient Information 2015 [place], [place]. This information is not intended to replace advice given to you by your  health care provider. Make sure you discuss any questions you have with your health care provider.

## 2014-02-02 NOTE — Progress Notes (Signed)
Patient ID: Michael Day, male   DOB: 12-07-1945, 68 y.o.   MRN: [id number]   Subjective:   Patient ID: Michael Day male   DOB: Jan 27, 1946 68 y.o.   MRN: [id number]  HPI: Mr.Michael Day is a 68 y.o. with PMH of HTN, OA, CKD3, tobacco abuse, HLD, presented today with complaints of cough over the past 2 weeks, which became worse over the weekend. Cough is productive of clear stuff he says. Pt also endorses runny nose that that about the same time. Pt has not tried any thing as he says he has no money, and was hoping we might have some free samples here in clinic to give him. No associated SOB, fever, or chest pain. No sick contacts, no myalgias or sorethroat. Pt is a current everyday smoker- 31/2 pack PPD, has cut down from 1 PPD- over the past month. Pt has no diagnosis of COPD. Pt has been smoking for decades he says.n Pt has tried chantrix twice without success, has never tried nicotine patch. He has some mild SOB only with Moderate exertion.   Pt has stress testing- Myoview - 01/19/2014, with resultant Cath- 01/24/2014- results- single vessel LAD, recs by Dr Johnsie Cancel were for medical therapy. Pt today asymptomatic.  Past Medical History  Diagnosis Date  . Hypertension   . Arthritis   . Adenomatous polyp of colon 2005    3 polyps removal per colonoscopy  . Tobacco dependence   . Tremor, essential   . Alcohol use   . Chest pain    Current Outpatient Prescriptions  Medication Sig Dispense Refill  . amLODipine (NORVASC) 5 MG tablet Take 5 mg by mouth daily.      . folic acid (FOLVITE) 1 MG tablet Take 1 mg by mouth daily.      . hydrochlorothiazide (HYDRODIURIL) 25 MG tablet Take 25 mg by mouth daily.      . isosorbide mononitrate (IMDUR) 30 MG 24 hr tablet Take 0.5 tablets (15 mg total) by mouth daily.  45 tablet  3  . lisinopril (PRINIVIL,ZESTRIL) 20 MG tablet Take 1 tablet (20 mg total) by mouth daily.  90 tablet  3  . naproxen sodium (ANAPROX) 220 MG tablet Take 440 mg by mouth daily as  needed (for pain).      Marland Kitchen omeprazole (PRILOSEC) 20 MG capsule Take 20 mg by mouth daily.      . propranolol (INDERAL) 40 MG tablet Take 40 mg by mouth 2 (two) times daily.      Marland Kitchen thiamine (VITAMIN B-1) 100 MG tablet Take 100 mg by mouth daily.       No current facility-administered medications for this visit.   Family History  Problem Relation Age of Onset  . Stroke Mother   . Prostate cancer Father    History   Social History  . Marital Status: Divorced    Spouse Name: N/A    Number of Children: N/A  . Years of Education: N/A   Social History Main Topics  . Smoking status: Current Every Day Smoker -- 1.00 packs/day    Types: Cigarettes  . Smokeless tobacco: None  . Alcohol Use: 2.5 - 3.0 oz/week    5-6 drink(s) per week     Comment: couple shots/ day  . Drug Use: No  . Sexual Activity: None   Other Topics Concern  . None   Social History Narrative   lives in [place].   Review of Systems: CONSTITUTIONAL- No Fever, weightloss, night sweat or change in  appetite. SKIN- No Rash, colour changes or itching. HEAD- No Headache or dizziness. Mouth/throat- No Sorethroat, dentures, or bleeding gums. CARDIAC- No Palpitations, DOE, PND or chest pain. GI- No nausea, vomiting, diarrhoea, constipation, abd pain. URINARY- No Frequency, urgency, straining or dysuria. NEUROLOGIC- No Numbness, syncope, seizures or burning. [facility]- Denies depression or anxiety.  Objective:  Physical Exam: Filed Vitals:   02/02/14 1550  BP: 135/79  Pulse: 66  Temp: 98.7 F (37.1 C)  TempSrc: Oral  Height: 5' 8.5" (1.74 m)  Weight: 191 lb 11.2 oz (86.955 kg)  SpO2: 98%   GENERAL- alert, co-operative, appears as stated age, not in any distress. HEENT- Atraumatic, normocephalic, PERRL, EOMI, oral mucosa appears moist,  a small- ~2 by 2 mm milky coloured swelling on left lat pharyngeal wall posteriorly, pt say she has always felt it and has been present since he was a child, no pharyngeal  erythema, no cervical LN enlargement,  neck supple. CARDIAC- RRR, no murmurs, rubs or gallops. RESP- Moving equal volumes of air, Transmited sounds from upper airway secretions heard diffusely bilat chest. ABDOMEN- Soft, nontender, no guarding or rebound, no palpable masses or organomegaly, bowel sounds present. BACK- Normal curvature of the spine, No tenderness along the vertebrae, no CVA tenderness. NEURO- No obvious Cr N abnormality, strenght upper and lower extremities-  intact, Gait- Normal. EXTREMITIES- pulse 2+, symmetric, no pedal edema, no calf tenderness. SKIN- Warm, dry, No rash or lesion. PSYCH- Normal mood and affect, appropriate thought content and speech.  Assessment & Plan:   The patient's case and plan of care was discussed with attending physician, Dr. Eppie Gibson.  Please see problem based charting for assessment and plan.

## 2014-02-02 NOTE — Telephone Encounter (Signed)
He has single vessel distal LAD disease and can be Rx medically  Should have f/u appt with me next available

## 2014-02-03 DIAGNOSIS — J069 Acute upper respiratory infection, unspecified: Secondary | ICD-10-CM | POA: Insufficient documentation

## 2014-02-03 NOTE — Assessment & Plan Note (Addendum)
Most likely considering- Acuteness of Cough with associated runny nose. No associated SOB, so doubt COPD. No PFTs on file. Physical exam- good air movement.   Will treat symptomatically for now. If worsening or unresolved in 2 weeks to see [place] in clinic.   Plan- Symptomatic treatment- Guaifenacin 400mg  Q4H, pt says he will try this or get delsium.

## 2014-02-05 NOTE — Progress Notes (Signed)
Case discussed with Dr. Emokpae soon after the resident saw the patient. We reviewed the resident's history and exam and pertinent patient test results. I agree with the assessment, diagnosis, and plan of care documented in the resident's note. 

## 2014-02-06 NOTE — Telephone Encounter (Signed)
PT  AWARE./CY 

## 2014-02-13 ENCOUNTER — Ambulatory Visit (INDEPENDENT_AMBULATORY_CARE_PROVIDER_SITE_OTHER): Payer: Medicare Other | Admitting: Cardiovascular Disease

## 2014-02-13 ENCOUNTER — Encounter: Payer: Self-pay | Admitting: Cardiovascular Disease

## 2014-02-13 VITALS — BP 114/82 | HR 87 | Ht 68.5 in | Wt 186.8 lb

## 2014-02-13 DIAGNOSIS — I10 Essential (primary) hypertension: Secondary | ICD-10-CM

## 2014-02-13 DIAGNOSIS — E785 Hyperlipidemia, unspecified: Secondary | ICD-10-CM

## 2014-02-13 DIAGNOSIS — I25118 Atherosclerotic heart disease of native coronary artery with other forms of angina pectoris: Secondary | ICD-10-CM

## 2014-02-13 DIAGNOSIS — I2510 Atherosclerotic heart disease of native coronary artery without angina pectoris: Secondary | ICD-10-CM

## 2014-02-13 DIAGNOSIS — M129 Arthropathy, unspecified: Secondary | ICD-10-CM

## 2014-02-13 DIAGNOSIS — M1990 Unspecified osteoarthritis, unspecified site: Secondary | ICD-10-CM

## 2014-02-13 DIAGNOSIS — I209 Angina pectoris, unspecified: Secondary | ICD-10-CM

## 2014-02-13 DIAGNOSIS — F17200 Nicotine dependence, unspecified, uncomplicated: Secondary | ICD-10-CM

## 2014-02-13 HISTORY — DX: Atherosclerotic heart disease of native coronary artery without angina pectoris: I25.10

## 2014-02-13 NOTE — Progress Notes (Signed)
Patient ID: Michael Day, male   DOB: 1945/12/23, 68 y.o.   MRN: [id number] 68 yo seen post cath 01/24/14  Had myovue suggesting inferior infarct Cath with diffuse 70% distal LAD disease as it wraps apex.  Medical RX  EF normal Echo 1/14 no valve disease  Started on nitrates post cath in addition to beta blocker  Compliant with meds no recurrent chest pain  Discussed not taking viagra with imdur.  Also counseled on smoking cessation and importance  In halting progression of now known disease.  He seems non noncommitted to quit    ROS: Denies fever, malais, weight loss, blurry vision, decreased visual acuity, cough, sputum, SOB, hemoptysis, pleuritic pain, palpitaitons, heartburn, abdominal pain, melena, lower extremity edema, claudication, or rash.  All other systems reviewed and negative  General: Affect appropriate Healthy:  appears stated age 63: normal Neck supple with no adenopathy JVP normal no bruits no thyromegaly Lungs clear with no wheezing and good diaphragmatic motion Heart:  S1/S2 no murmur, no rub, gallop or click PMI normal Abdomen: benighn, BS positve, no tenderness, no AAA no bruit.  No HSM or HJR Distal pulses intact with no bruits No edema Neuro non-focal Skin warm and dry No muscular weakness   Current Outpatient Prescriptions  Medication Sig Dispense Refill  . amLODipine (NORVASC) 5 MG tablet Take 5 mg by mouth daily.      . folic acid (FOLVITE) 1 MG tablet Take 1 mg by mouth daily.      . hydrochlorothiazide (HYDRODIURIL) 25 MG tablet Take 25 mg by mouth daily.      . isosorbide mononitrate (IMDUR) 30 MG 24 hr tablet Take 0.5 tablets (15 mg total) by mouth daily.  45 tablet  3  . lisinopril (PRINIVIL,ZESTRIL) 20 MG tablet Take 1 tablet (20 mg total) by mouth daily.  90 tablet  3  . naproxen sodium (ANAPROX) 220 MG tablet Take 440 mg by mouth daily as needed (for pain).      . propranolol (INDERAL) 40 MG tablet Take 40 mg by mouth 2 (two) times daily.       Marland Kitchen thiamine (VITAMIN B-1) 100 MG tablet Take 100 mg by mouth daily.       No current facility-administered medications for this visit.    Allergies  Review of patient's allergies indicates no known allergies.  Electrocardiogram:  SR rate 59  Normal   Assessment and Plan

## 2014-02-13 NOTE — Patient Instructions (Signed)
Your physician wants you to follow-up in:   [place] will receive a reminder letter in the mail two months in advance. If you don't receive a letter, please call our office to schedule the follow-up appointment. Your physician recommends that you continue on your current medications as directed. Please refer to the Current Medication list given to you today.  You have been referred to [facility]

## 2014-02-13 NOTE — Assessment & Plan Note (Signed)
Stable post cath  Tolerating nitrates  Diffuse distal LAD disease normal RCA, LM and circumflex

## 2014-02-13 NOTE — Assessment & Plan Note (Signed)
Well controlled.  Continue current medications and low sodium Dash type diet.    

## 2014-02-13 NOTE — Assessment & Plan Note (Signed)
Suggested nicotine patches patient will consider see HPI

## 2014-02-13 NOTE — Assessment & Plan Note (Signed)
Cholesterol is at goal.  Continue current dose of statin and diet Rx.  No myalgias or side effects.  F/U  LFT's in 6 months. Lab Results  Component Value Date   LDLCALC 111* 03/27/2013             

## 2014-03-26 ENCOUNTER — Ambulatory Visit (INDEPENDENT_AMBULATORY_CARE_PROVIDER_SITE_OTHER): Payer: Commercial Managed Care - HMO | Admitting: Internal Medicine

## 2014-03-26 ENCOUNTER — Encounter: Payer: Self-pay | Admitting: Internal Medicine

## 2014-03-26 VITALS — BP 126/82 | HR 65 | Temp 97.1°F | Wt 189.3 lb

## 2014-03-26 DIAGNOSIS — F1010 Alcohol abuse, uncomplicated: Secondary | ICD-10-CM

## 2014-03-26 DIAGNOSIS — R3989 Other symptoms and signs involving the genitourinary system: Secondary | ICD-10-CM

## 2014-03-26 DIAGNOSIS — R39198 Other difficulties with micturition: Secondary | ICD-10-CM | POA: Insufficient documentation

## 2014-03-26 DIAGNOSIS — I25118 Atherosclerotic heart disease of native coronary artery with other forms of angina pectoris: Secondary | ICD-10-CM

## 2014-03-26 DIAGNOSIS — N183 Chronic kidney disease, stage 3 unspecified: Secondary | ICD-10-CM

## 2014-03-26 DIAGNOSIS — I10 Essential (primary) hypertension: Secondary | ICD-10-CM

## 2014-03-26 DIAGNOSIS — F17200 Nicotine dependence, unspecified, uncomplicated: Secondary | ICD-10-CM

## 2014-03-26 DIAGNOSIS — Z0000 Encounter for general adult medical examination without abnormal findings: Secondary | ICD-10-CM

## 2014-03-26 HISTORY — DX: Other difficulties with micturition: R39.198

## 2014-03-26 NOTE — Patient Instructions (Addendum)
General Instructions: -Please let us know if you have difficulty emptying your bladder.  -Continue taking your medications as prescribed.  -They will call you with the appointment for the kidney specialist.  -Call 1-800-QUIT-NOW for free nicotine patches. -Follow up with [place] in 3 months.    Please bring your medicines with you each time you come to clinic.  Medicines may include prescription medications, over-the-counter medications, herbal remedies, eye drops, vitamins, or other pills.   Progress Toward Treatment Goals:  Treatment Goal 08/14/2013  Blood pressure at goal  Stop smoking smoking the same amount    Self Care Goals & Plans:  Self Care Goal 03/26/2014  Manage my medications take my medicines as prescribed; bring my medications to every visit; refill my medications on time  Monitor my health -  Eat healthy foods eat foods that are low in salt; eat baked foods instead of fried foods  Be physically active find an activity I enjoy  Stop smoking go to the [employer] (https://scott-booker.info/); call [facility] (1-800-QUIT-NOW)  Meeting treatment goals -    No flowsheet data found.   Care Management & Community Referrals:  Referral 03/27/2013  Referrals made for care management support none needed

## 2014-03-26 NOTE — Assessment & Plan Note (Addendum)
Borderline CKD stage 3, now with complaint of decreased urine flow at times.  -referred to Nephrology -Advised pt to avoid NSAIDs

## 2014-03-26 NOTE — Assessment & Plan Note (Addendum)
BP Readings from Last 3 Encounters:  03/26/14 126/82  02/13/14 114/82  02/02/14 135/79    Lab Results  Component Value Date   NA 140 01/23/2014   K 3.8 01/23/2014   CREATININE 1.5 01/23/2014    Assessment: Blood pressure control: controlled Progress toward BP goal:  at goal Comments: He is on Norvasc 5mg  daily, Imdur 30mg  daily, HCTZ 25mg  daily, lisinopril 20mg  daily, and propanolol 40mg  BID  Plan: Medications:  continue current medications Educational resources provided: brochure Self management tools provided: home blood pressure logbook Other plans: Follow up in 3 months.  ....Marland KitchenMarland Kitchen

## 2014-03-26 NOTE — Assessment & Plan Note (Addendum)
  Assessment: Progress toward smoking cessation:  smoking less Barriers to progress toward smoking cessation:  none Comments: He is cutting back, interested in quiting  Plan: Instruction/counseling given:  I counseled patient on the dangers of tobacco use, advised patient to stop smoking, and reviewed strategies to maximize success. Educational resources provided:  [facility] [employer] account manager) brochure Self management tools provided:  smoking cessation plan (STAR Quit Plan) Medications to assist with smoking cessation:  Nicotine Patch Patient agreed to the following self-care plans for smoking cessation: go to the [employer] (https://scott-booker.info/), call [facility] (1-800-QUIT-NOW)  Other plans: He will call 1-800-QUIT-NOW for free nicotine patches

## 2014-03-26 NOTE — Assessment & Plan Note (Signed)
Followed by Cardiology, on statin, BB, imdur, no recent CP.

## 2014-03-26 NOTE — Assessment & Plan Note (Signed)
Pt advised to stop Aleve and NSAIDs, may take Tylenol 500mg  4 times daily PRN for arthritis

## 2014-03-26 NOTE — Progress Notes (Signed)
Patient ID: Michael Day, male   DOB: April 25, 1946, 68 y.o.   MRN: [id number] Case discussed with Dr. Hayes Ludwig soon after the resident saw the patient.  We reviewed the resident's history and exam and pertinent patient test results.  I agree with the assessment, diagnosis, and plan of care documented in the resident's note.

## 2014-03-26 NOTE — Progress Notes (Signed)
   Subjective:    Patient ID: Michael Day, male    DOB: [birth year], 68 y.o.   MRN: [id number]  HPI Michael Day is a 68 yr old man with PMH of HTN, EtOH abuse, tobacco use, CAD, who presents for follow up visit. He has seen his Cardiologist recently with no changes in medications. He has exercitional chest pain but none recently. He continues to smoke but is cutting back. His alcohol intake has also decreased to 4 shots of liquor per day.  For years he has had difficulty fully emptying his bladder and states that his former PCP at [facility] used to check his PSA and would do DRE yearly with no problems, he was never told he had prostate problems. He is not certain if his dad had prostate cancer.    Review of Systems  Constitutional: Negative for fever, chills, diaphoresis, activity change, appetite change and fatigue.  Respiratory: Negative for shortness of breath.        Reports a "smoker's cough" for years  Cardiovascular: Negative for chest pain, palpitations and leg swelling.  Gastrointestinal: Negative for abdominal pain, diarrhea, constipation and blood in stool.  Genitourinary: Negative for dysuria.       Occasional straining with voiding with diminished flow, has had this for years  Neurological: Negative for dizziness, tremors, seizures, weakness, light-headedness and headaches.  Psychiatric/Behavioral: Negative for agitation.       Objective:   Physical Exam  Constitutional: He is oriented to person, place, and time. He appears well-developed and well-nourished. No distress.  Eyes:  Slight conjunctival injection bilaterally   Cardiovascular: Normal rate and regular rhythm.   Pulmonary/Chest: Effort normal and breath sounds normal. No respiratory distress. He has no wheezes. He has no rales.  Abdominal: Soft. Bowel sounds are normal. He exhibits no distension and no mass. There is no tenderness. There is no rebound and no guarding.  Liver edge could not be palpated but  under costal margin per percussion   Genitourinary:  Declines DRE  Musculoskeletal: He exhibits no edema or tenderness.  Neurological: He is alert and oriented to person, place, and time. Coordination normal.  Skin: Skin is warm and dry. He is not diaphoretic.  Psychiatric: He has a normal mood and affect.  Nursing note and vitals reviewed.         Assessment & Plan:

## 2014-03-26 NOTE — Assessment & Plan Note (Addendum)
He reports straining with urination off and on for years. BPH unlikely given that this does not happen with every time he urinates but possible. He reports no history of BPH, however. He has had PSA monitored in the past with DRE that were wnl.  He is not sure if his father had prostate cancer. His last  PSA in 03/2013 was wnl at 3.98. He declines DRE, PSA lab, and referral to Urology, states that this problem is not bothering him as much.  Pt will call us, and follow up if urinary difficulty worsens.

## 2014-03-26 NOTE — Assessment & Plan Note (Addendum)
He is cutting back on alcohol consumption, 4 shots per day of liquor. Declines referral to community resources for alcohol cessation.

## 2014-03-26 NOTE — Assessment & Plan Note (Signed)
Declines all immunizations today.

## 2014-04-04 ENCOUNTER — Telehealth: Payer: Self-pay | Admitting: Cardiovascular Disease

## 2014-04-04 NOTE — Telephone Encounter (Signed)
New Msg  Patient wants to make sure he can use cigarette patches because there is a disclosure about patients with heart concerns.

## 2014-04-04 NOTE — Telephone Encounter (Signed)
S/w pt  After I went to triage nurse Enis Slipper, RN, stated pt could use patches was listed in Dr. Kyla Balzarine last office note. Pt also stated if could take another form of ED medicine since. I stated no since pt was on imdur.  Pt stated verbal understanding.

## 2014-04-13 ENCOUNTER — Telehealth: Payer: Self-pay | Admitting: Cardiovascular Disease

## 2014-04-13 NOTE — Telephone Encounter (Signed)
Instructed patient his message will be forwarded to our pharmacist, Gay Filler, and Dr. Johnsie Cancel to ensure nothing on his medication list is contraindicated with Turmeric.

## 2014-04-13 NOTE — Telephone Encounter (Signed)
New Prob    Pt has some questions regarding Turmeric Curcumin as a medication. Please call.

## 2014-04-14 NOTE — Telephone Encounter (Signed)
Ok to take tumeric

## 2014-04-16 ENCOUNTER — Other Ambulatory Visit: Payer: Self-pay | Admitting: *Deleted

## 2014-04-16 NOTE — Telephone Encounter (Signed)
Informed patient that per Dr. Johnsie Cancel, it is OK for him to take turmeric.

## 2014-04-17 MED ORDER — AMLODIPINE BESYLATE 5 MG PO TABS: 5.0000 mg | ORAL_TABLET | Freq: Every day | ORAL | Status: DC

## 2014-04-17 MED ORDER — PROPRANOLOL HCL 40 MG PO TABS: 40.0000 mg | ORAL_TABLET | Freq: Two times a day (BID) | ORAL | Status: AC

## 2014-04-17 MED ORDER — HYDROCHLOROTHIAZIDE 25 MG PO TABS: 25.0000 mg | ORAL_TABLET | Freq: Every day | ORAL | Status: DC

## 2014-04-18 ENCOUNTER — Other Ambulatory Visit: Payer: Self-pay | Admitting: Internal Medicine

## 2014-04-23 ENCOUNTER — Other Ambulatory Visit: Payer: Self-pay

## 2014-04-23 MED ORDER — ISOSORBIDE MONONITRATE ER 30 MG PO TB24: 15.0000 mg | ORAL_TABLET | Freq: Every day | ORAL | Status: AC

## 2014-04-26 ENCOUNTER — Encounter (HOSPITAL_COMMUNITY): Payer: Self-pay | Admitting: Cardiovascular Disease

## 2014-04-30 ENCOUNTER — Telehealth: Payer: Self-pay | Admitting: Cardiovascular Disease

## 2014-04-30 ENCOUNTER — Other Ambulatory Visit: Payer: Self-pay | Admitting: *Deleted

## 2014-04-30 MED ORDER — VITAMIN B-1 100 MG PO TABS: 100.0000 mg | ORAL_TABLET | Freq: Every day | ORAL | Status: DC

## 2014-04-30 MED ORDER — FOLIC ACID 1 MG PO TABS: 1.0000 mg | ORAL_TABLET | Freq: Every day | ORAL | Status: DC

## 2014-04-30 MED ORDER — OMEPRAZOLE 20 MG PO CPDR: 20.0000 mg | DELAYED_RELEASE_CAPSULE | Freq: Every day | ORAL | Status: AC

## 2014-04-30 NOTE — Telephone Encounter (Signed)
lmtcb

## 2014-04-30 NOTE — Telephone Encounter (Signed)
Calling stating that [facility] hasn't heard from our office regarding his medication Isosorbide. [facility] and was told that Isosorbide was mailed out on 12/10.  They had all of his medications except Omeprazole.  Gave verbal order to pharmacist Lequita Asal) for Omeprazole.  Notified pt that Isosorbide had been mailed and that had given them order for the Omeprazole.  He appreciates [place] taking care of his medications. States he just wanted to get the medicines that were ordered.

## 2014-04-30 NOTE — Telephone Encounter (Signed)
Follow Up    Pt is following up on paperwork to be faxed to his insurance company regarding his prescriptions. Please call.

## 2014-05-15 ENCOUNTER — Other Ambulatory Visit: Payer: Self-pay | Admitting: *Deleted

## 2014-05-15 MED ORDER — FOLIC ACID 1 MG PO TABS: 1.0000 mg | ORAL_TABLET | Freq: Every day | ORAL | Status: AC

## 2014-05-15 MED ORDER — VITAMIN B-1 100 MG PO TABS: 100.0000 mg | ORAL_TABLET | Freq: Every day | ORAL | Status: AC

## 2014-05-15 NOTE — Telephone Encounter (Signed)
Call from pt requesting rx to be sent [facility] [address]  Rx had been sent to [facility] mail order pharmacy, but pt states it's not covered by [facility] but he could get it for $4 at [facility].  Will send request to pcp for signature.Despina Hidden Cassady12/29/20159:55 AM

## 2014-05-17 ENCOUNTER — Emergency Department (HOSPITAL_COMMUNITY): Payer: Commercial Managed Care - HMO

## 2014-05-17 ENCOUNTER — Encounter (HOSPITAL_COMMUNITY): Payer: Self-pay | Admitting: *Deleted

## 2014-05-17 ENCOUNTER — Inpatient Hospital Stay (HOSPITAL_COMMUNITY): Payer: Commercial Managed Care - HMO

## 2014-05-17 ENCOUNTER — Inpatient Hospital Stay (HOSPITAL_COMMUNITY)
Admission: EM | Admit: 2014-05-17 | Discharge: 2014-05-30 | DRG: 165 | Disposition: A | Discharge status: Home / Self Care | Payer: Commercial Managed Care - HMO | Attending: Thoracic Surgery (Cardiothoracic Vascular Surgery) | Admitting: Thoracic Surgery (Cardiothoracic Vascular Surgery)

## 2014-05-17 DIAGNOSIS — I129 Hypertensive chronic kidney disease with stage 1 through stage 4 chronic kidney disease, or unspecified chronic kidney disease: Secondary | ICD-10-CM | POA: Diagnosis present

## 2014-05-17 DIAGNOSIS — I10 Essential (primary) hypertension: Secondary | ICD-10-CM | POA: Diagnosis present

## 2014-05-17 DIAGNOSIS — F1010 Alcohol abuse, uncomplicated: Secondary | ICD-10-CM | POA: Diagnosis present

## 2014-05-17 DIAGNOSIS — J939 Pneumothorax, unspecified: Secondary | ICD-10-CM

## 2014-05-17 DIAGNOSIS — J449 Chronic obstructive pulmonary disease, unspecified: Secondary | ICD-10-CM | POA: Diagnosis present

## 2014-05-17 DIAGNOSIS — J9383 Other pneumothorax: Secondary | ICD-10-CM | POA: Diagnosis present

## 2014-05-17 DIAGNOSIS — F17210 Nicotine dependence, cigarettes, uncomplicated: Secondary | ICD-10-CM | POA: Diagnosis present

## 2014-05-17 DIAGNOSIS — I2510 Atherosclerotic heart disease of native coronary artery without angina pectoris: Secondary | ICD-10-CM | POA: Diagnosis present

## 2014-05-17 DIAGNOSIS — Z9689 Presence of other specified functional implants: Secondary | ICD-10-CM

## 2014-05-17 DIAGNOSIS — R0602 Shortness of breath: Secondary | ICD-10-CM

## 2014-05-17 DIAGNOSIS — J439 Emphysema, unspecified: Secondary | ICD-10-CM | POA: Diagnosis present

## 2014-05-17 DIAGNOSIS — N183 Chronic kidney disease, stage 3 unspecified: Secondary | ICD-10-CM | POA: Diagnosis present

## 2014-05-17 DIAGNOSIS — F17200 Nicotine dependence, unspecified, uncomplicated: Secondary | ICD-10-CM | POA: Diagnosis present

## 2014-05-17 DIAGNOSIS — Z79899 Other long term (current) drug therapy: Secondary | ICD-10-CM | POA: Diagnosis not present

## 2014-05-17 DIAGNOSIS — J9382 Other air leak: Secondary | ICD-10-CM | POA: Diagnosis not present

## 2014-05-17 HISTORY — DX: Cardiac murmur, unspecified: R01.1

## 2014-05-17 HISTORY — DX: Gastro-esophageal reflux disease without esophagitis: K21.9

## 2014-05-17 HISTORY — DX: Other difficulties with micturition: R39.198

## 2014-05-17 HISTORY — DX: Alcohol abuse, uncomplicated: F10.10

## 2014-05-17 HISTORY — DX: Other specified chronic obstructive pulmonary disease: J44.89

## 2014-05-17 HISTORY — DX: Chronic kidney disease, stage 3 (moderate): N18.3

## 2014-05-17 HISTORY — DX: Atherosclerotic heart disease of native coronary artery without angina pectoris: I25.10

## 2014-05-17 HISTORY — DX: Other pneumothorax: J93.83

## 2014-05-17 HISTORY — PX: CHEST TUBE INSERTION: SHX231

## 2014-05-17 HISTORY — DX: Chronic obstructive pulmonary disease, unspecified: J44.9

## 2014-05-17 LAB — BASIC METABOLIC PANEL
Anion gap: 8 (ref 5–15)
BUN: 16 mg/dL (ref 6–23)
CHLORIDE: 101 meq/L (ref 96–112)
CO2: 28 mmol/L (ref 19–32)
CREATININE: 1.28 mg/dL (ref 0.50–1.35)
Calcium: 9.6 mg/dL (ref 8.4–10.5)
GFR calc Af Amer: 66 mL/min — ABNORMAL LOW (ref >90)
GFR calc non Af Amer: 57 mL/min — ABNORMAL LOW (ref >90)
GLUCOSE: 120 mg/dL — AB (ref 70–99)
Potassium: 3.8 mmol/L (ref 3.5–5.1)
Sodium: 137 mmol/L (ref 135–145)

## 2014-05-17 LAB — I-STAT TROPONIN, ED: Troponin i, poc: 0.01 ng/mL (ref 0.00–0.08)

## 2014-05-17 LAB — CBC
HEMATOCRIT: 44.7 % (ref 39.0–52.0)
Hemoglobin: 15.3 g/dL (ref 13.0–17.0)
MCH: 32.4 pg (ref 26.0–34.0)
MCHC: 34.2 g/dL (ref 30.0–36.0)
MCV: 94.7 fL (ref 78.0–100.0)
Platelets: 186 10*3/uL (ref 150–400)
RBC: 4.72 MIL/uL (ref 4.22–5.81)
RDW: 13.3 % (ref 11.5–15.5)
WBC: 7.1 10*3/uL (ref 4.0–10.5)

## 2014-05-17 LAB — BRAIN NATRIURETIC PEPTIDE: B Natriuretic Peptide: 16.6 pg/mL (ref 0.0–100.0)

## 2014-05-17 MED ORDER — OXYCODONE HCL 5 MG PO TABS: 5.0000 mg | ORAL_TABLET | ORAL | Status: DC | PRN

## 2014-05-17 MED ORDER — LISINOPRIL 20 MG PO TABS
20.0000 mg | ORAL_TABLET | Freq: Every day | ORAL | Status: DC
  Administered 2014-05-18 – 2014-05-30 (×11): 20 mg via ORAL
  Filled 2014-05-17 (×13): qty 1

## 2014-05-17 MED ORDER — PROPRANOLOL HCL 40 MG PO TABS
40.0000 mg | ORAL_TABLET | Freq: Two times a day (BID) | ORAL | Status: DC
  Administered 2014-05-17 – 2014-05-30 (×23): 40 mg via ORAL
  Filled 2014-05-17 (×30): qty 1

## 2014-05-17 MED ORDER — PNEUMOCOCCAL VAC POLYVALENT 25 MCG/0.5ML IJ INJ
0.5000 mL | INJECTION | INTRAMUSCULAR | Status: DC
  Filled 2014-05-17 (×2): qty 0.5

## 2014-05-17 MED ORDER — LIDOCAINE HCL (PF) 2 % IJ SOLN
10.0000 mL | Freq: Once | INTRAMUSCULAR | Status: AC
  Administered 2014-05-17: 10 mL
  Filled 2014-05-17: qty 10

## 2014-05-17 MED ORDER — MORPHINE SULFATE 2 MG/ML IJ SOLN: 2.0000 mg | INTRAMUSCULAR | Status: DC | PRN

## 2014-05-17 MED ORDER — OXYCODONE HCL 5 MG PO TABS
10.0000 mg | ORAL_TABLET | ORAL | Status: DC | PRN
  Administered 2014-05-17 – 2014-05-21 (×11): 10 mg via ORAL
  Filled 2014-05-17 (×11): qty 2

## 2014-05-17 MED ORDER — DOCUSATE SODIUM 100 MG PO CAPS
200.0000 mg | ORAL_CAPSULE | Freq: Every day | ORAL | Status: DC
  Administered 2014-05-18 – 2014-05-30 (×11): 200 mg via ORAL
  Filled 2014-05-17 (×14): qty 2

## 2014-05-17 MED ORDER — HYDROMORPHONE HCL 1 MG/ML IJ SOLN
1.0000 mg | Freq: Once | INTRAMUSCULAR | Status: AC
  Administered 2014-05-17: 1 mg via INTRAVENOUS
  Filled 2014-05-17: qty 1

## 2014-05-17 MED ORDER — AMLODIPINE BESYLATE 5 MG PO TABS
5.0000 mg | ORAL_TABLET | Freq: Every day | ORAL | Status: DC
  Administered 2014-05-18 – 2014-05-27 (×9): 5 mg via ORAL
  Filled 2014-05-17 (×11): qty 1

## 2014-05-17 MED ORDER — HYDROCHLOROTHIAZIDE 25 MG PO TABS
25.0000 mg | ORAL_TABLET | Freq: Every day | ORAL | Status: DC
  Administered 2014-05-18 – 2014-05-27 (×9): 25 mg via ORAL
  Filled 2014-05-17 (×11): qty 1

## 2014-05-17 MED ORDER — MORPHINE SULFATE 2 MG/ML IJ SOLN
2.0000 mg | Freq: Once | INTRAMUSCULAR | Status: AC
  Administered 2014-05-17: 2 mg via INTRAVENOUS
  Filled 2014-05-17: qty 1

## 2014-05-17 MED ORDER — PANTOPRAZOLE SODIUM 40 MG PO TBEC
40.0000 mg | DELAYED_RELEASE_TABLET | Freq: Every day | ORAL | Status: DC
  Administered 2014-05-18 – 2014-05-30 (×12): 40 mg via ORAL
  Filled 2014-05-17 (×12): qty 1

## 2014-05-17 MED ORDER — TRAMADOL HCL 50 MG PO TABS
50.0000 mg | ORAL_TABLET | Freq: Two times a day (BID) | ORAL | Status: DC | PRN
  Administered 2014-05-17 – 2014-05-19 (×2): 50 mg via ORAL
  Filled 2014-05-17 (×2): qty 1

## 2014-05-17 MED ORDER — DM-GUAIFENESIN ER 30-600 MG PO TB12
1.0000 | ORAL_TABLET | Freq: Two times a day (BID) | ORAL | Status: DC
  Administered 2014-05-17 – 2014-05-30 (×25): 1 via ORAL
  Filled 2014-05-17 (×30): qty 1

## 2014-05-17 MED ORDER — ISOSORBIDE MONONITRATE 15 MG HALF TABLET
15.0000 mg | ORAL_TABLET | Freq: Every day | ORAL | Status: DC
  Administered 2014-05-18 – 2014-05-30 (×12): 15 mg via ORAL
  Filled 2014-05-17 (×13): qty 1

## 2014-05-17 MED ORDER — ENOXAPARIN SODIUM 30 MG/0.3ML ~~LOC~~ SOLN
30.0000 mg | SUBCUTANEOUS | Status: DC
  Administered 2014-05-18 – 2014-05-21 (×4): 30 mg via SUBCUTANEOUS
  Filled 2014-05-17 (×9): qty 0.3

## 2014-05-17 MED ORDER — IPRATROPIUM-ALBUTEROL 0.5-2.5 (3) MG/3ML IN SOLN
3.0000 mL | Freq: Four times a day (QID) | RESPIRATORY_TRACT | Status: DC | PRN
  Administered 2014-05-20: 3 mL via RESPIRATORY_TRACT
  Filled 2014-05-17: qty 3

## 2014-05-17 MED ORDER — FOLIC ACID 1 MG PO TABS
1.0000 mg | ORAL_TABLET | Freq: Every day | ORAL | Status: DC
  Administered 2014-05-18 – 2014-05-30 (×12): 1 mg via ORAL
  Filled 2014-05-17 (×13): qty 1

## 2014-05-17 MED ORDER — VITAMIN B-1 100 MG PO TABS
100.0000 mg | ORAL_TABLET | Freq: Every day | ORAL | Status: DC
  Administered 2014-05-18 – 2014-05-30 (×12): 100 mg via ORAL
  Filled 2014-05-17 (×13): qty 1

## 2014-05-17 MED ORDER — MIDAZOLAM HCL 2 MG/2ML IJ SOLN
2.0000 mg | Freq: Once | INTRAMUSCULAR | Status: AC
  Administered 2014-05-17: 2 mg via INTRAVENOUS
  Filled 2014-05-17: qty 2

## 2014-05-17 MED ORDER — ACETAMINOPHEN 325 MG PO TABS
650.0000 mg | ORAL_TABLET | ORAL | Status: DC | PRN
  Administered 2014-05-19: 650 mg via ORAL
  Filled 2014-05-17: qty 2

## 2014-05-17 NOTE — ED Notes (Signed)
Dr. Roxy Manns at bedside to obtain consent.

## 2014-05-17 NOTE — ED Notes (Signed)
Pt reports having productive cough for extended amount of time, it seems to be getting worse and pt is feeling sob, reports white sputum. Airway intact at triage and ekg done.

## 2014-05-17 NOTE — Op Note (Signed)
CARDIOTHORACIC SURGERY OPERATIVE NOTE  Date of Procedure:  05/17/2014  Preoperative Diagnosis: Right Spontaneous Pneumothorax  Postoperative Diagnosis: Same  Procedure:   Right chest tube placement  Surgeon:   Valentina Gu. Roxy Manns, MD  Anesthesia: 1% lidocaine local with intravenous sedation    DETAILS OF THE OPERATIVE PROCEDURE  Following full informed consent the patient was given midazolam 2 mg intravenously and continuously monitored for rhythm, BP and oxygen saturation. The right chest was prepared and draped in a sterile manner. 1% lidocaine was utilized to anesthetize the skin and subcutaneous tissues. A small incision was made and a 28 French straight chest tube was placed through the incision into the pleural space. The tube was secured to the skin and connected to a closed suction collection device. The patient tolerated the procedure well. A portable CXR was ordered. There were no complications.    Valentina Gu. Roxy Manns, MD 05/17/2014 3:40 PM

## 2014-05-17 NOTE — ED Provider Notes (Signed)
CSN: [id number]     Arrival date & time 05/17/14  1310 History   First MD Initiated Contact with Patient 05/17/14 1406     Chief Complaint  Patient presents with  . Cough  . Shortness of Breath      HPI Pt reports worsening SOB and cough for 5 days. Began abruptly.  Has had a recent upper respiratory symptoms.  No fevers or chills.  Reports productive cough.  Reports a developed some initial pain in his right scapular region.  Patient does have a history of left-sided pneumothorax back in 2011 that required tube thoracostomy for treatment.  No history of emphysema.  Continues to smoke cigarettes.   Past Medical History  Diagnosis Date  . Hypertension   . Arthritis   . Adenomatous polyp of colon 2005    3 polyps removal per colonoscopy  . Tobacco dependence   . Tremor, essential   . Alcohol use   . Chest pain    Past Surgical History  Procedure Laterality Date  . Colonoscopy      hx of polyp  . Colonoscopy  08/19/2011    Procedure: COLONOSCOPY;  Surgeon: Lear Ng, MD;  Location: [facility];  Service: Endoscopy;  Laterality: N/A;  . Left heart catheterization with coronary angiogram N/A 01/24/2014    Procedure: LEFT HEART CATHETERIZATION WITH CORONARY ANGIOGRAM;  Surgeon: Josue Hector, MD;  Location: [facility] CATH LAB;  Service: Cardiovascular;  Laterality: N/A;   Family History  Problem Relation Age of Onset  . Stroke Mother   . Prostate cancer Father    History  Substance Use Topics  . Smoking status: Current Every Day Smoker -- 1.00 packs/day    Types: Cigarettes  . Smokeless tobacco: Not on file  . Alcohol Use: 3.0 - 3.6 oz/week    5-6 Not specified per week     Comment: couple shots/ day    Review of Systems  All other systems reviewed and are negative.     Allergies  Review of patient's allergies indicates no known allergies.  Home Medications   Prior to Admission medications   Medication Sig Start Date End Date Taking? Authorizing Provider   amLODipine (NORVASC) 5 MG tablet Take 1 tablet (5 mg total) by mouth daily. 04/17/14   Blain Pais, MD  folic acid (FOLVITE) 1 MG tablet Take 1 tablet (1 mg total) by mouth daily. 05/15/14   Blain Pais, MD  hydrochlorothiazide (HYDRODIURIL) 25 MG tablet Take 1 tablet (25 mg total) by mouth daily. 04/17/14   Blain Pais, MD  isosorbide mononitrate (IMDUR) 30 MG 24 hr tablet Take 0.5 tablets (15 mg total) by mouth daily. 04/23/14   Josue Hector, MD  lisinopril (PRINIVIL,ZESTRIL) 20 MG tablet Take 1 tablet (20 mg total) by mouth daily. 01/26/14   Josue Hector, MD  naproxen sodium (ANAPROX) 220 MG tablet Take 440 mg by mouth daily as needed (for pain).    Historical Provider, MD  omeprazole (PRILOSEC) 20 MG capsule Take 1 capsule (20 mg total) by mouth daily. 04/30/14   Blain Pais, MD  propranolol (INDERAL) 40 MG tablet Take 1 tablet (40 mg total) by mouth 2 (two) times daily. 04/17/14   Blain Pais, MD  thiamine (VITAMIN B-1) 100 MG tablet Take 1 tablet (100 mg total) by mouth daily. 05/15/14   Blain Pais, MD   BP 130/85 mmHg  Pulse 78  Temp(Src) 98 F (36.7 C) (Oral)  Resp 16  SpO2 94% Physical Exam  Constitutional: He is oriented to person, place, and time. He appears well-developed and well-nourished.  HENT:  Head: Normocephalic and atraumatic.  Eyes: EOM are normal.  Neck: Normal range of motion.  Cardiovascular: Normal rate, regular rhythm, normal heart sounds and intact distal pulses.   Pulmonary/Chest: Effort normal. No respiratory distress.  Decreased breath sounds on right  Abdominal: Soft. He exhibits no distension. There is no tenderness.  Musculoskeletal: Normal range of motion.  Neurological: He is alert and oriented to person, place, and time.  Skin: Skin is warm and dry.  Psychiatric: He has a normal mood and affect. Judgment normal.  Nursing note and vitals reviewed.   ED Course  Procedures (including critical care  time) Labs Review Labs Reviewed  BASIC METABOLIC PANEL - Abnormal; Notable for the following:    Glucose, Bld 120 (*)    GFR calc non Af Amer 57 (*)    GFR calc Af Amer 66 (*)    All other components within normal limits  CBC  BRAIN NATRIURETIC PEPTIDE  I-STAT TROPOININ, ED    Imaging Review Dg Chest 2 View  05/17/2014   CLINICAL DATA:  Productive cough and a worsening shortness of breath. Smoker.  EXAM: CHEST  2 VIEW  COMPARISON:  08/22/2009.  FINDINGS: Interval large right pneumothorax with complete collapse of the right lung. No mediastinal shift. The heart remains normal in size and the left lung is clear. Unremarkable bones.  IMPRESSION: Interval large right pneumothorax with complete collapse of the right lung. No evidence of tension.  Critical Value/emergent results were called by telephone at the time of interpretation on 05/17/2014 at 2:15 pm to Dr. Jola Schmidt , who verbally acknowledged these results.   Electronically Signed   By: Enrique Sack M.D.   On: 05/17/2014 14:16  I personally reviewed the imaging tests through PACS system I reviewed available ER/hospitalization records through the EMR    EKG Interpretation   Date/Time:  Thursday May 17 2014 13:14:59 EST Ventricular Rate:  86 PR Interval:  152 QRS Duration: 82 QT Interval:  354 QTC Calculation: 423 R Axis:   81 Text Interpretation:  Normal sinus rhythm Normal ECG No significant change  was found Confirmed by Evadean Sproule  MD, Tamiya Colello ([id number]) on 05/17/2014 3:04:48 PM      MDM   Final diagnoses:  SOB (shortness of breath)   Large right pneumothorax without tension. CT surgery to place right sided chest tube. Vitals normal    Hoy Morn, MD 05/17/14 [id number]

## 2014-05-17 NOTE — ED Notes (Signed)
Doctor spoke with patient and family members about procedure.  Doctor completed consent and stated will not be conscious sedation.

## 2014-05-17 NOTE — ED Notes (Addendum)
Pt reports generalized cp/sob since this weekend with increased sob when walking.  Crackles heard on auscultation.  Pt has hx of heart cath.  Pt has bilateral crackles in lung fields.  Pt also reports right flank pain x2 days with no burning on urination.

## 2014-05-17 NOTE — ED Notes (Signed)
Pt sat 88, placed on 2L [place], md notified.

## 2014-05-17 NOTE — ED Notes (Signed)
Consent at bedside.  

## 2014-05-17 NOTE — ED Notes (Signed)
Pt taken to XR.  

## 2014-05-17 NOTE — H&P (Addendum)
[address]       [place] [address]             [phone]          CARDIOTHORACIC SURGERY HISTORY AND PHYSICAL EXAM  PCP is Blain Pais, MD Referring Provider is Hoy Morn, MD   Chief Complaint:  Right sided chest pain and SOB  HPI:  Patient is a 68 year old African-American male from [place] with history of coronary artery disease, hypertension, long-standing tobacco abuse, alcohol abuse, and previous left sided spontaneous pneumothorax treated with chest tube placement by Dr. Servando Snare in 2011. The patient states that he was in his usual state of good health until this past Monday when he developed sudden onset right-sided chest pain and shortness of breath. He states that he has a chronic cough, but symptoms of cough became acutely exacerbated. Symptoms persisted all week and felt similar to how he recalled feeling in 2011 when he presented with left-sided spontaneous pneumothorax. He denies any recent history of trauma. He denies any previous history of spontaneous pneumothorax on the right side. He denies any productive cough, hemoptysis, or wheezing. He presented to the emergency department where chest radiography demonstrates complete collapse of the right lung with large right pneumothorax. Cardiothoracic surgical consultation was requested.  The patient lives alone locally in [place]. He has been retired since 2011. He lives a somewhat sedentary lifestyle. He drinks alcohol regularly and sometimes excessively and he admits that he has been drinking too much over the holidays. He has a long-standing history of tobacco abuse and has tried several means to find a way to quit smoking, all unsuccessfully.  Past Medical History  Diagnosis Date  . Hypertension   . Arthritis   . Adenomatous polyp of colon 2005    3 polyps removal per colonoscopy  . Tobacco dependence   . Tremor, essential   . Alcohol use   . Chest pain   . CAD (coronary  artery disease) 02/13/2014    70% LAD stenosis - followed by Dr Johnsie Cancel  . Chronic kidney disease (CKD), stage III (moderate) 03/27/2013  . Spontaneous pneumothorax 08/05/2009    Left sided- requiring chest tube placement in 2011.   Marland Kitchen Difficulty urinating 03/26/2014  . Alcohol abuse     He reports drinking one 5ht of liquor throughout the weekend and another 5th throughout the week.      Past Surgical History  Procedure Laterality Date  . Colonoscopy      hx of polyp  . Colonoscopy  08/19/2011    Procedure: COLONOSCOPY;  Surgeon: Lear Ng, MD;  Location: [facility];  Service: Endoscopy;  Laterality: N/A;  . Left heart catheterization with coronary angiogram N/A 01/24/2014    Procedure: LEFT HEART CATHETERIZATION WITH CORONARY ANGIOGRAM;  Surgeon: Josue Hector, MD;  Location: [facility] CATH LAB;  Service: Cardiovascular;  Laterality: N/A;  . Chest tube insertion Left 08/05/2009    Dr Servando Snare    Family History  Problem Relation Age of Onset  . Stroke Mother   . Prostate cancer Father     Social History History  Substance Use Topics  . Smoking status: Current Every Day Smoker -- 1.00 packs/day    Types: Cigarettes  . Smokeless tobacco: Not on file  . Alcohol Use: 3.0 - 3.6 oz/week    5-6 Not specified per week     Comment: couple shots/ day    Prior to Admission medications   Medication  Sig Start Date End Date Taking? Authorizing Provider  amLODipine (NORVASC) 5 MG tablet Take 1 tablet (5 mg total) by mouth daily. 04/17/14  Yes Blain Pais, MD  folic acid (FOLVITE) 1 MG tablet Take 1 tablet (1 mg total) by mouth daily. 05/15/14  Yes Blain Pais, MD  hydrochlorothiazide (HYDRODIURIL) 25 MG tablet Take 1 tablet (25 mg total) by mouth daily. 04/17/14  Yes Blain Pais, MD  isosorbide mononitrate (IMDUR) 30 MG 24 hr tablet Take 0.5 tablets (15 mg total) by mouth daily. 04/23/14  Yes Josue Hector, MD  lisinopril (PRINIVIL,ZESTRIL) 20 MG tablet Take 1 tablet  (20 mg total) by mouth daily. 01/26/14  Yes Josue Hector, MD  naproxen sodium (ANAPROX) 220 MG tablet Take 440 mg by mouth daily as needed (for pain).   Yes Historical Provider, MD  omeprazole (PRILOSEC) 20 MG capsule Take 1 capsule (20 mg total) by mouth daily. 04/30/14  Yes Blain Pais, MD  propranolol (INDERAL) 40 MG tablet Take 1 tablet (40 mg total) by mouth 2 (two) times daily. 04/17/14  Yes Blain Pais, MD  thiamine (VITAMIN B-1) 100 MG tablet Take 1 tablet (100 mg total) by mouth daily. 05/15/14  Yes Blain Pais, MD    No Known Allergies  Review of Systems:  General:  normal appetite, normal energy   Respiratory:  + chronic non-productive cough, no wheezing, no hemoptysis, + pain with inspiration or cough, + shortness of breath   Cardiac:  + right sided chest pain or tightness, no recent h/o exertional chest pain, + mild chronic exertional SOB, no resting SOB prior to current problem, no PND, no orthopnea, no LE edema, no palpitations, no syncope  GI:   no difficulty swallowing, no hematochezia, no hematemesis, no melena, no constipation, no diarrhea   GU:   no dysuria, no urgency, no frequency   Musculoskeletal: no arthritis, no arthralgia   Vascular:  no pain suggestive of claudication   Neuro:   no symptoms suggestive of TIA's, no seizures, no headaches, no peripheral neuropathy   Endocrine:  Negative   HEENT:  no loose teeth or painful teeth,  no recent vision changes  Psych:   no anxiety, no depression    Physical Exam:   BP 112/65 mmHg  Pulse 85  Temp(Src) 98 F (36.7 C) (Oral)  Resp 24  SpO2 95%  General:  Appears somewhat older than stated age, no acute distress  HEENT:  Unremarkable other than poor dentition  Neck:   no JVD, no bruits, no adenopathy   Chest:   Diminished breath sounds on right, no wheezes, + scattered rhonchi  CV:   RRR, no murmur   Abdomen:  soft, non-tender, no masses   Extremities:  warm, well-perfused, pulses diminished  but palpable  Rectal/GU  Deferred  Neuro:   Grossly non-focal and symmetrical throughout  Skin:   Clean and dry, no rashes, no breakdown  Diagnostic Tests:  CHEST 2 VIEW  COMPARISON: 08/22/2009.  FINDINGS: Interval large right pneumothorax with complete collapse of the right lung. No mediastinal shift. The heart remains normal in size and the left lung is clear. Unremarkable bones.  IMPRESSION: Interval large right pneumothorax with complete collapse of the right lung. No evidence of tension.  Critical Value/emergent results were called by telephone at the time of interpretation on 05/17/2014 at 2:15 pm to Dr. Jola Schmidt , who verbally acknowledged these results.   Electronically Signed  By: Georg Ruddle.D.  On: 05/17/2014 14:16   Impression:  Large right spontaneous pneumothorax in patient w/ history of spontaneous pneumothorax on the left side in 2011, long-standing tobacco abuse w/ likely chronic bronchitis and COPD.  Other significant chronic medical problems include CAD treated medically, hypertension, CKD and alcohol abuse.   Plan:  Chest tube placement and hospitalization.  Indications, risks and potential benefits discussed w/ patient and his sister at bedside.  Alternative treatment strategies discussed.  Expectations for his subsequent hospital course and recovery discussed.  Patient will need admission to telemetry unit.  Lovenox for DVT prophylaxis.  Nebs and pulmonary toilet.  Mobilize as much as possible.   I spent in excess of 60 minutes during the conduct of this hospital encounter and >50% of this time involved direct face-to-face encounter with the patient for counseling and/or coordination of their care.  Valentina Gu. Roxy Manns, MD

## 2014-05-18 ENCOUNTER — Inpatient Hospital Stay (HOSPITAL_COMMUNITY): Payer: Commercial Managed Care - HMO

## 2014-05-18 NOTE — Progress Notes (Addendum)
       [address]       [place] [address]             [phone]               Subjective: Sore with cough. Breathing stable.   Objective: Vital signs in last 24 hours: Patient Vitals for the past 24 hrs:  BP Temp Temp src Pulse Resp SpO2 Height Weight  05/18/14 0348 118/84 mmHg 98.8 F (37.1 C) Oral 74 18 97 % - -  05/17/14 2240 128/85 mmHg - - 79 - 97 % - -  05/17/14 1949 129/83 mmHg 98.3 F (36.8 C) Oral 94 - 96 % - -  05/17/14 1651 116/65 mmHg 97.7 F (36.5 C) Oral 80 20 95 % 5' 8.5" (1.74 m) 183 lb 13.8 oz (83.4 kg)  05/17/14 1615 127/76 mmHg - - 73 (!) 35 100 % - -  05/17/14 1600 117/71 mmHg - - 83 (!) 30 99 % - -  05/17/14 1530 112/65 mmHg - - 85 24 95 % - -  05/17/14 1515 116/77 mmHg - - 77 15 97 % - -  05/17/14 1510 143/89 mmHg - - 81 18 96 % - -  05/17/14 1445 143/73 mmHg - - 79 24 95 % - -  05/17/14 1430 136/94 mmHg - - 86 22 96 % - -  05/17/14 1415 140/90 mmHg - - 82 24 97 % - -  05/17/14 1345 130/85 mmHg - - 78 16 94 % - -  05/17/14 1319 126/85 mmHg 98 F (36.7 C) Oral 86 18 96 % - -   Current Weight  05/17/14 183 lb 13.8 oz (83.4 kg)     Intake/Output from previous day: 12/31 0701 - 01/01 0700 In: 120 [P.O.:120] Out: 405 [Urine:375; Chest Tube:30]    PHYSICAL EXAM:  Heart: RRR Lungs: Clear Chest tube: No air leak with cough    Lab Results: CBC: Recent Labs  05/17/14 1324  WBC 7.1  HGB 15.3  HCT 44.7  PLT 186   BMET:  Recent Labs  05/17/14 1324  NA 137  K 3.8  CL 101  CO2 28  GLUCOSE 120*  BUN 16  CREATININE 1.28  CALCIUM 9.6    PT/INR: No results for input(s): LABPROT, INR in the last 72 hours.  CXR: FINDINGS: Right chest tube remains in place. No visible pneumothorax. Subcutaneous emphysema within the right chest wall, stable. Areas of right lung atelectasis, improved since prior study. Left lung is clear. Heart is upper limits normal in size.  IMPRESSION: No visible right pneumothorax.  Stable subcutaneous emphysema.  Improving right lung atelectasis.  Assessment/Plan: CT has a few bubble intermittently on suction, but no specific air leak with cough. Will continue CT to suction for now. Mucinex for cough.   LOS: 1 day    COLLINS,GINA H 05/18/2014  I have seen and examined the patient and agree with the assessment and plan as outlined.  I spent in excess of 15 minutes during the conduct of this hospital encounter and >50% of this time involved direct face-to-face encounter with the patient for counseling and/or coordination of their care.   Taysia Rivere H 05/18/2014 10:21 AM

## 2014-05-19 ENCOUNTER — Inpatient Hospital Stay (HOSPITAL_COMMUNITY): Payer: Commercial Managed Care - HMO

## 2014-05-19 MED ORDER — CETYLPYRIDINIUM CHLORIDE 0.05 % MT LIQD
7.0000 mL | Freq: Two times a day (BID) | OROMUCOSAL | Status: DC
  Administered 2014-05-19 – 2014-05-30 (×15): 7 mL via OROMUCOSAL

## 2014-05-19 NOTE — Progress Notes (Addendum)
       [address]       [place] [address]             [phone]               Subjective: Stable, no complaints.   Objective: Vital signs in last 24 hours: Patient Vitals for the past 24 hrs:  BP Temp Temp src Pulse Resp SpO2  05/19/14 0504 120/76 mmHg 98.7 F (37.1 C) Oral 66 16 100 %  05/18/14 2123 110/80 mmHg 98.3 F (36.8 C) Oral 71 - 100 %  05/18/14 1321 111/64 mmHg 98 F (36.7 C) Oral 63 19 100 %  05/18/14 0945 122/77 mmHg - - 82 - -   Current Weight  05/17/14 183 lb 13.8 oz (83.4 kg)     Intake/Output from previous day: 01/01 0701 - 01/02 0700 In: 1486 [P.O.:1486] Out: 1826 [Urine:1775; Stool:1; Chest Tube:50]    PHYSICAL EXAM:  Heart: RRR Lungs: Few coarse BS bilaterally Chest tube: No air leak with cough    Lab Results: CBC: Recent Labs  05/17/14 1324  WBC 7.1  HGB 15.3  HCT 44.7  PLT 186   BMET:  Recent Labs  05/17/14 1324  NA 137  K 3.8  CL 101  CO2 28  GLUCOSE 120*  BUN 16  CREATININE 1.28  CALCIUM 9.6    PT/INR: No results for input(s): LABPROT, INR in the last 72 hours.  CXR:  Stable, no obvious ptx  Assessment/Plan: CT continues to bubble intermittently on suction, but there is no air leak with cough. CXR stable with no pneumothorax.  Will place CT to suction.   LOS: 2 days    COLLINS,GINA H 05/19/2014  I have seen and examined the patient and agree with the assessment and plan as outlined.  Try water seal.  Will get non-contrast CT chest to look for blebs  I spent in excess of 15 minutes during the conduct of this hospital encounter and >50% of this time involved direct face-to-face encounter with the patient for counseling and/or coordination of their care.   Cornelis Kluver H 05/19/2014 10:45 AM

## 2014-05-19 NOTE — Progress Notes (Signed)
Pt ambulated in hallway 500 ft on 2 liters of oxygen and tolerated activity well. Will continue to monitor.

## 2014-05-20 ENCOUNTER — Inpatient Hospital Stay (HOSPITAL_COMMUNITY): Payer: Commercial Managed Care - HMO

## 2014-05-20 MED ORDER — DOXYCYCLINE HYCLATE 100 MG PO TABS
100.0000 mg | ORAL_TABLET | Freq: Two times a day (BID) | ORAL | Status: AC
  Administered 2014-05-20 – 2014-05-26 (×13): 100 mg via ORAL
  Filled 2014-05-20 (×15): qty 1

## 2014-05-20 NOTE — Progress Notes (Signed)
Pt ambulated in hallway 500 ft with rolling walker on 2 liters of oxygen. Pt tolerated activity well. Will continue to monitor.

## 2014-05-20 NOTE — Progress Notes (Signed)
Pt ambulated in hallway 500 ft with rolling walker and 2 liters of oxygen. Pt tolerated activity well. Will continue to monitor.

## 2014-05-20 NOTE — Progress Notes (Addendum)
[address]       ,[place] [address]             [phone]               Subjective: Comfortable, breathing stable.   Objective: Vital signs in last 24 hours: Patient Vitals for the past 24 hrs:  BP Temp Temp src Pulse Resp SpO2  05/20/14 0606 106/63 mmHg 98.3 F (36.8 C) Oral 61 18 99 %  05/20/14 0305 - - - - - 98 %  05/19/14 2015 (!) 101/57 mmHg 98.2 F (36.8 C) Oral - 16 100 %  05/19/14 1316 96/65 mmHg 98.1 F (36.7 C) Oral 65 18 100 %   Current Weight  05/17/14 183 lb 13.8 oz (83.4 kg)     Intake/Output from previous day: 01/02 0701 - 01/03 0700 In: 420 [P.O.:420] Out: 1740 [Urine:1690; Chest Tube:50]    PHYSICAL EXAM:  Heart: RRR Lungs: Decreased BS in bases Chest tube: 2-3/7 air leak with cough    Lab Results: CBC: Recent Labs  05/17/14 1324  WBC 7.1  HGB 15.3  HCT 44.7  PLT 186   BMET:  Recent Labs  05/17/14 1324  NA 137  K 3.8  CL 101  CO2 28  GLUCOSE 120*  BUN 16  CREATININE 1.28  CALCIUM 9.6    PT/INR: No results for input(s): LABPROT, INR in the last 72 hours.  CT chest (1/2): EXAM: CT CHEST WITHOUT CONTRAST  TECHNIQUE: Multidetector CT imaging of the chest was performed following the standard protocol without IV contrast.  COMPARISON: No prior CT. Multiple recent chest x-rays.  FINDINGS: Right chest tube in place with residual small (5-10% or so) right pneumothorax. Numerous blebs in both lung apices, the largest actually in the left lung apex medially. Emphysematous changes throughout both lungs. Linear atelectasis in the right lower lobe. Lungs otherwise clear without localized airspace consolidation, interstitial disease, or parenchymal nodules or masses. Very small right pleural effusion. No left pleural effusion.  Subcutaneous emphysema in the right chest wall. No significant mediastinal, hilar or axillary lymphadenopathy. Visualized thyroid gland unremarkable.  Heart size  upper normal. Moderate to severe LAD and left circumflex coronary atherosclerosis with likely calcified and noncalcified plaque in the LAD. No pericardial effusion. 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 Mild atherosclerosis involving the aortic arch.  Cysts in the visualized portion of the left lobe of the liver. Allowing for the unenhanced technique, no significant abnormalities in the visualized upper abdomen. Bone window images demonstrate mid and lower thoracic spondylosis.  IMPRESSION: 1. Numerous blebs in both lung apices. 2. Residual small (5-10% or so) right pneumothorax with right chest tube in place. 3. Mild atelectasis in the right lower lobe. No acute cardiopulmonary disease otherwise. 4. Subcutaneous emphysema in the right chest wall. 5. Moderate to severe LAD and left circumflex coronary atherosclerosis with likely calcified and noncalcified plaque in the LAD. 6. 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 Recommend annual imaging followup by CTA or MRA. This recommendation follows 2010 ACCF/AHA/AATS/ACR/ASA/SCA/SCAI/SIR/STS/SVM Guidelines for the Diagnosis and Management of Patients With Thoracic Aortic Disease. Circulation. 2010; 121: F810-F751.   Electronically Signed  By: Evangeline Dakin M.D.  On: 05/19/2014 13:26      CXR:  Small apical pneumothorax   Assessment/Plan: CT with 2-3/7 air leak today, CXR with small apical ptx.  Will leave CT to water seal for now and watch. CT chest with numerous bilateral  blebs.  Pt may ultimately require VATS/bleb resection as he has had a prior left spontaneous ptx requiring CT placement.   LOS: 3 days    COLLINS,GINA H 05/20/2014  I have seen and examined the patient and agree with the assessment and plan as outlined.  Will add Doxycycline for tracheobronchitis.  Air leak persists but lung remains well-expanded w/ chest tube in good position.  Continue chest tube to water  seal.  Discussed options for management of air leak persists, including VATS vs d/c home with chest tube on mini-express.  I spent in excess of 15 minutes during the conduct of this hospital encounter and >50% of this time involved direct face-to-face encounter with the patient for counseling and/or coordination of their care.   Michael Day H 05/20/2014 11:43 AM

## 2014-05-21 ENCOUNTER — Inpatient Hospital Stay (HOSPITAL_COMMUNITY): Payer: Commercial Managed Care - HMO

## 2014-05-21 DIAGNOSIS — J9383 Other pneumothorax: Secondary | ICD-10-CM

## 2014-05-21 NOTE — Progress Notes (Addendum)
      [address]       [place] [address]             [phone]      Subjective:  Mr. Bucklin has no complaints this morning.  He notices a crackling sensation at his chest tube insertion site with movement.  Objective: Vital signs in last 24 hours: Temp:  [98.6 F (37 C)-99.7 F (37.6 C)] 98.6 F (37 C) (01/04 0530) Pulse Rate:  [64-69] 64 (01/04 0530) Cardiac Rhythm:  [-] Normal sinus rhythm (01/03 0750) Resp:  [18-20] 18 (01/04 0530) BP: (94-111)/(47-68) 111/68 mmHg (01/04 0530) SpO2:  [99 %-100 %] 100 % (01/03 2101)  Intake/Output from previous day: 01/03 0701 - 01/04 0700 In: 720 [P.O.:720] Out: 1300 [Urine:1300]  General appearance: alert, cooperative and no distress Heart: regular rate and rhythm Lungs: clear to auscultation bilaterally Abdomen: soft, non-tender; bowel sounds normal; no masses,  no organomegaly Wound: clean and dry, some crepitus along chest tube site  Lab Results: No results for input(s): WBC, HGB, HCT, PLT in the last 72 hours. BMET: No results for input(s): NA, K, CL, CO2, GLUCOSE, BUN, CREATININE, CALCIUM in the last 72 hours.  PT/INR: No results for input(s): LABPROT, INR in the last 72 hours. ABG    Component Value Date/Time   TCO2 24 03/13/2011 0924   CBG (last 3)  No results for input(s): GLUCAP in the last 72 hours.  Assessment/Plan:  1. Chest tube- transitioned to water seal yesterday.  +3 air leak with cough.  CXR with minimal pneumothorax.  Will leave chest tube in place until air leak resolves. Repeat CXR in AM   LOS: 4 days    BARRETT, ERIN 05/21/2014  I have seen and examined the patient and agree with the assessment and plan as outlined.  I spent in excess of 15 minutes during the conduct of this hospital encounter and >50% of this time involved direct face-to-face encounter with the patient for counseling and/or coordination of their care.   Gwen Sarvis H 05/21/2014 8:02 AM

## 2014-05-22 ENCOUNTER — Inpatient Hospital Stay (HOSPITAL_COMMUNITY): Payer: Commercial Managed Care - HMO

## 2014-05-22 NOTE — Progress Notes (Addendum)
       [address]       [place] [address]             [phone]               Subjective: Comfortable, breathing stable.  "I'm ready to go home."   Objective: Vital signs in last 24 hours: Patient Vitals for the past 24 hrs:  BP Temp Temp src Pulse Resp SpO2  05/22/14 0544 (!) 97/55 mmHg 98.4 F (36.9 C) Oral 66 18 98 %  05/21/14 1942 106/63 mmHg 99.1 F (37.3 C) Oral 75 20 99 %  05/21/14 1510 (!) 93/59 mmHg 99.1 F (37.3 C) Oral 67 18 99 %  05/21/14 0947 104/68 mmHg - - 78 - 98 %   Current Weight  05/17/14 183 lb 13.8 oz (83.4 kg)     Intake/Output from previous day: 01/04 0701 - 01/05 0700 In: 720 [P.O.:720] Out: 200 [Urine:200]    PHYSICAL EXAM:  Heart: RRR Lungs: Slightly decreased BS in R base Chest tube: + air leak    Lab Results: CBC:No results for input(s): WBC, HGB, HCT, PLT in the last 72 hours. BMET: No results for input(s): NA, K, CL, CO2, GLUCOSE, BUN, CREATININE, CALCIUM in the last 72 hours.  PT/INR: No results for input(s): LABPROT, INR in the last 72 hours.  CXR:  FINDINGS: Right chest tube in stable position. Stable tiny right pneumothorax, best seen on lateral view. Stable right chest and bilateral neck subcutaneous emphysema. Mediastinum and hilar structures complete heart size normal. No focal pulmonary infiltrate. No pleural effusion. No displaced fracture noted.  IMPRESSION: 1. Stable tiny right pneumothorax, best seen on lateral view. Right chest tube in stable position. Subcutaneous emphysema in the right chest wall and bilateral neck stable. 2. No focal infiltrate. Heart size stable.   Assessment/Plan: CT with persistent stable air leak, CXR unchanged. Continue CT to water seal.  Possibly could transition to Mini Express soon.   LOS: 5 days    COLLINS,GINA H 05/22/2014  I have seen and examined the patient and agree with the assessment and plan as outlined.  Air leak persists.  Patient hopes  to avoid surgery if possible.  If air leak doesn't resolve w/in the next 2-3 days will convert to Mini X-press and d/c home later this week, providing that patient has 24 hr/day supervision/assistance with one of his family members.  He has been reminded that smoking can no longer be considered an option.  Will get ABG and PFT's while he's here.    I spent in excess of 15 minutes during the conduct of this hospital encounter and >50% of this time involved direct face-to-face encounter with the patient for counseling and/or coordination of their care.    OWEN,CLARENCE H 05/22/2014 11:14 AM

## 2014-05-22 NOTE — Progress Notes (Signed)
RRT made RN aware of pt's refusal of ABG despite education from RRT and RN. MD paged and no new orders received. Will continue to monitor pt closely.

## 2014-05-22 NOTE — Progress Notes (Signed)
Patient refusing ABG at this time.  Stated he is tired of being stuck.  RN made aware.

## 2014-05-22 NOTE — Progress Notes (Signed)
Utilization review completed.  

## 2014-05-22 NOTE — Progress Notes (Signed)
Pt ambulated 500 ft with no complaints. Pt assisted back to chair with call bell in reach. Will continue to monitor pt closely.

## 2014-05-22 NOTE — Progress Notes (Signed)
Medicare Important Message given? YES  (If response is "NO", the following Medicare IM given date fields will be blank)  Date Medicare IM given: 05/22/14 Medicare IM given by:  Braelyn Bordonaro  

## 2014-05-23 ENCOUNTER — Inpatient Hospital Stay (HOSPITAL_COMMUNITY): Payer: Commercial Managed Care - HMO

## 2014-05-23 LAB — PULMONARY FUNCTION TEST
DL/VA % pred: 107 %
DL/VA: 4.83 ml/min/mmHg/L
DLCO COR % PRED: 69 %
DLCO UNC: 21 ml/min/mmHg
DLCO cor: 20.6 ml/min/mmHg
DLCO unc % pred: 70 %
FEF 25-75 POST: 0.71 L/s
FEF 25-75 Pre: 0.85 L/sec
FEF2575-%CHANGE-POST: -15 %
FEF2575-%PRED-PRE: 34 %
FEF2575-%Pred-Post: 29 %
FEV1-%Change-Post: -3 %
FEV1-%PRED-POST: 50 %
FEV1-%Pred-Pre: 51 %
FEV1-PRE: 1.42 L
FEV1-Post: 1.37 L
FEV1FVC-%Change-Post: -1 %
FEV1FVC-%Pred-Pre: 90 %
FEV6-%Change-Post: -3 %
FEV6-%PRED-POST: 56 %
FEV6-%Pred-Pre: 58 %
FEV6-POST: 1.93 L
FEV6-PRE: 2 L
FEV6FVC-%Change-Post: -1 %
FEV6FVC-%Pred-Post: 100 %
FEV6FVC-%Pred-Pre: 102 %
FVC-%Change-Post: -2 %
FVC-%PRED-POST: 55 %
FVC-%PRED-PRE: 56 %
FVC-POST: 2 L
FVC-Pre: 2.05 L
PRE FEV1/FVC RATIO: 69 %
Post FEV1/FVC ratio: 69 %
Post FEV6/FVC ratio: 97 %
Pre FEV6/FVC Ratio: 98 %
RV % pred: 164 %
RV: 3.73 L
TLC % PRED: 81 %
TLC: 5.38 L

## 2014-05-23 MED ORDER — ALBUTEROL SULFATE (2.5 MG/3ML) 0.083% IN NEBU
2.5000 mg | INHALATION_SOLUTION | Freq: Once | RESPIRATORY_TRACT | Status: AC
  Administered 2014-05-23: 2.5 mg via RESPIRATORY_TRACT

## 2014-05-23 NOTE — Progress Notes (Addendum)
      [address]       ,[place] [address]             [phone]            Subjective: Patient had PFTs done earlier this am. Denies difficulty breathing.  Objective: Vital signs in last 24 hours: Temp:  [98.2 F (36.8 C)-98.9 F (37.2 C)] 98.5 F (36.9 C) (01/06 0559) Pulse Rate:  [62-81] 63 (01/06 0559) Cardiac Rhythm:  [-] Normal sinus rhythm (01/06 0745) Resp:  [18-20] 18 (01/06 0559) BP: (102-123)/(59-78) 102/67 mmHg (01/06 0559) SpO2:  [95 %-100 %] 100 % (01/06 0559)      Intake/Output from previous day: 01/05 0701 - 01/06 0700 In: 960 [P.O.:960] Out: 250 [Urine:250]   Physical Exam:  Cardiovascular: RRR Pulmonary: Clear to auscultation on left and diminished righ apex; no rales, wheezes, or rhonchi. Abdomen: Soft, non tender, bowel sounds present. Extremities: No lower extremity edema. Chest Tube: to water seal and there is a persistent air leak that worsens with cough  Lab Results: CBC:No results for input(s): WBC, HGB, HCT, PLT in the last 72 hours. BMET: No results for input(s): NA, K, CL, CO2, GLUCOSE, BUN, CREATININE, CALCIUM in the last 72 hours.  PT/INR: No results for input(s): LABPROT, INR in the last 72 hours. ABG:  INR: Will add last result for INR, ABG once components are confirmed Will add last 4 CBG results once components are confirmed  Assessment/Plan:  1. CV - SR in the 70's. 2.  Pulmonary - CXR this am shows persistent right pneumothorax, subcutaneous emphysema right lateral chest wall and into neck. Has persistent air leak. Hope to transition to mini express soon. 3. Await PFT results   ZIMMERMAN,DONIELLE MPA-C 05/23/2014,8:49 AM

## 2014-05-24 ENCOUNTER — Inpatient Hospital Stay (HOSPITAL_COMMUNITY): Payer: Commercial Managed Care - HMO

## 2014-05-24 DIAGNOSIS — J9383 Other pneumothorax: Secondary | ICD-10-CM

## 2014-05-24 LAB — URINALYSIS, ROUTINE W REFLEX MICROSCOPIC
Bilirubin Urine: NEGATIVE
Glucose, UA: NEGATIVE mg/dL
Hgb urine dipstick: NEGATIVE
Ketones, ur: NEGATIVE mg/dL
Leukocytes, UA: NEGATIVE
NITRITE: NEGATIVE
PH: 5 (ref 5.0–8.0)
Protein, ur: NEGATIVE mg/dL
SPECIFIC GRAVITY, URINE: 1.022 (ref 1.005–1.030)
Urobilinogen, UA: 1 mg/dL (ref 0.0–1.0)

## 2014-05-24 LAB — BLOOD GAS, ARTERIAL
Acid-base deficit: 1.4 mmol/L (ref 0.0–2.0)
Bicarbonate: 22.6 mEq/L (ref 20.0–24.0)
Drawn by: 358491
FIO2: 0.21 %
O2 SAT: 96.6 %
PATIENT TEMPERATURE: 98.6
TCO2: 23.7 mmol/L (ref 0–100)
pCO2 arterial: 36.7 mmHg (ref 35.0–45.0)
pH, Arterial: 7.406 (ref 7.350–7.450)
pO2, Arterial: 86.5 mmHg (ref 80.0–100.0)

## 2014-05-24 LAB — COMPREHENSIVE METABOLIC PANEL
ALK PHOS: 61 U/L (ref 39–117)
ALT: 25 U/L (ref 0–53)
ANION GAP: 7 (ref 5–15)
AST: 22 U/L (ref 0–37)
Albumin: 3.2 g/dL — ABNORMAL LOW (ref 3.5–5.2)
BILIRUBIN TOTAL: 0.7 mg/dL (ref 0.3–1.2)
BUN: 29 mg/dL — AB (ref 6–23)
CHLORIDE: 103 meq/L (ref 96–112)
CO2: 22 mmol/L (ref 19–32)
Calcium: 8.9 mg/dL (ref 8.4–10.5)
Creatinine, Ser: 1.4 mg/dL — ABNORMAL HIGH (ref 0.50–1.35)
GFR calc Af Amer: 59 mL/min — ABNORMAL LOW (ref >90)
GFR calc non Af Amer: 51 mL/min — ABNORMAL LOW (ref >90)
GLUCOSE: 126 mg/dL — AB (ref 70–99)
POTASSIUM: 3.9 mmol/L (ref 3.5–5.1)
SODIUM: 132 mmol/L — AB (ref 135–145)
TOTAL PROTEIN: 6.5 g/dL (ref 6.0–8.3)

## 2014-05-24 LAB — ABO/RH: ABO/RH(D): A POS

## 2014-05-24 LAB — CBC
HCT: 41.3 % (ref 39.0–52.0)
Hemoglobin: 14.1 g/dL (ref 13.0–17.0)
MCH: 32.9 pg (ref 26.0–34.0)
MCHC: 34.1 g/dL (ref 30.0–36.0)
MCV: 96.3 fL (ref 78.0–100.0)
Platelets: 205 10*3/uL (ref 150–400)
RBC: 4.29 MIL/uL (ref 4.22–5.81)
RDW: 13.1 % (ref 11.5–15.5)
WBC: 5.9 10*3/uL (ref 4.0–10.5)

## 2014-05-24 LAB — TYPE AND SCREEN
ABO/RH(D): A POS
Antibody Screen: NEGATIVE

## 2014-05-24 LAB — SURGICAL PCR SCREEN
MRSA, PCR: NEGATIVE
Staphylococcus aureus: NEGATIVE

## 2014-05-24 LAB — PROTIME-INR
INR: 1.11 (ref 0.00–1.49)
Prothrombin Time: 14.4 seconds (ref 11.6–15.2)

## 2014-05-24 LAB — APTT: aPTT: 56 seconds — ABNORMAL HIGH (ref 24–37)

## 2014-05-24 MED ORDER — DEXTROSE 5 % IV SOLN
1.5000 g | INTRAVENOUS | Status: AC
  Administered 2014-05-25: 1.5 g via INTRAVENOUS
  Filled 2014-05-24 (×2): qty 1.5

## 2014-05-24 NOTE — Progress Notes (Addendum)
       [address]       [place], [address]             [phone]               Subjective: "My jaw swelled up after that breathing test yesterday."  Breathing stable, now off O2.   Objective: Vital signs in last 24 hours: Patient Vitals for the past 24 hrs:  BP Temp Temp src Pulse Resp SpO2  05/24/14 0306 (!) 88/50 mmHg 99.1 F (37.3 C) Oral 75 (!) 22 92 %  05/23/14 2007 102/65 mmHg 98.6 F (37 C) Oral 79 18 99 %  05/23/14 1327 100/61 mmHg 99 F (37.2 C) Oral 69 20 99 %  05/23/14 1115 107/72 mmHg - - 79 - -   Current Weight  05/17/14 183 lb 13.8 oz (83.4 kg)     Intake/Output from previous day: 01/06 0701 - 01/07 0700 In: 960 [P.O.:960] Out: 200 [Urine:200]    PHYSICAL EXAM:  Heart: RRR Lungs: Decreased BS on R Neck: subcutaneous emphysema appears a little more pronounced in right neck/jaw area today Chest tube: + large air leak    Lab Results: CBC:No results for input(s): WBC, HGB, HCT, PLT in the last 72 hours. BMET: No results for input(s): NA, K, CL, CO2, GLUCOSE, BUN, CREATININE, CALCIUM in the last 72 hours.  PT/INR: No results for input(s): LABPROT, INR in the last 72 hours.    Assessment/Plan: Air leak appears to be increased today with worsening SQ air.  CXR not done yet, so ordered. Continue CT to water seal for now.   LOS: 7 days    COLLINS,GINA H 05/24/2014  I have seen and examined the patient and agree with the assessment and plan as outlined.  However, large air leak persists and subcutaneous emphysema is worse.  Will replace tube to suction.  Patient needs surgical intervention.  I have reviewed the indications, risks and potential benefits of VATS for bleb resection and pleurodesis with the patient.  All questions answered.  Will tentatively plan for OR tomorrow.  I spent in excess of 30 minutes during the conduct of this hospital encounter and >50% of this time involved direct face-to-face encounter with the  patient for counseling and/or coordination of their care.   Rexene Alberts 05/24/2014 9:06 AM   Discussed surgical plans at length with patient and his family  For OR tomorrow.  The patient understands and accepts all potential associated risks of surgery including but not limited to risk of death, myocardial infarction, respiratory failure, bleeding requiring blood transfusion and/or reexploration, arrhythmia, pneumonia, pleural effusion, wound infection, pulmonary embolus or other thromboembolic complication, chronic pain, prolonged air leak, recurrent spontaneous pneumothorax, or other delayed complications.  All questions answered. Marland Kitchen  Emeri Estill H 05/24/2014 6:03 PM

## 2014-05-25 ENCOUNTER — Inpatient Hospital Stay (HOSPITAL_COMMUNITY): Payer: Commercial Managed Care - HMO | Admitting: Certified Registered Nurse Anesthetist

## 2014-05-25 ENCOUNTER — Encounter (HOSPITAL_COMMUNITY): Payer: Self-pay | Admitting: Thoracic Surgery (Cardiothoracic Vascular Surgery)

## 2014-05-25 ENCOUNTER — Encounter (HOSPITAL_COMMUNITY)
Admission: EM | Disposition: A | Discharge status: Home / Self Care | Payer: Self-pay | Source: Home / Self Care | Attending: Thoracic Surgery (Cardiothoracic Vascular Surgery)

## 2014-05-25 ENCOUNTER — Inpatient Hospital Stay (HOSPITAL_COMMUNITY): Payer: Commercial Managed Care - HMO

## 2014-05-25 HISTORY — PX: VIDEO ASSISTED THORACOSCOPY (VATS)/THOROCOTOMY: SHX6173

## 2014-05-25 SURGERY — VIDEO ASSISTED THORACOSCOPY (VATS)/THOROCOTOMY
Anesthesia: General | Site: Chest | Laterality: Right

## 2014-05-25 MED ORDER — LACTATED RINGERS IV SOLN
INTRAVENOUS | Status: DC
  Administered 2014-05-25: 12:00:00 via INTRAVENOUS

## 2014-05-25 MED ORDER — MORPHINE SULFATE 2 MG/ML IJ SOLN
2.0000 mg | INTRAMUSCULAR | Status: DC | PRN
  Administered 2014-05-25 – 2014-05-27 (×3): 2 mg via INTRAVENOUS
  Filled 2014-05-25 (×3): qty 1

## 2014-05-25 MED ORDER — DEXTROSE 5 % IV SOLN
1.5000 g | Freq: Two times a day (BID) | INTRAVENOUS | Status: AC
  Administered 2014-05-26 (×2): 1.5 g via INTRAVENOUS
  Filled 2014-05-25 (×2): qty 1.5

## 2014-05-25 MED ORDER — HYDROMORPHONE HCL 1 MG/ML IJ SOLN
INTRAMUSCULAR | Status: AC
  Filled 2014-05-25: qty 1

## 2014-05-25 MED ORDER — MIDAZOLAM HCL 2 MG/2ML IJ SOLN
INTRAMUSCULAR | Status: AC
  Administered 2014-05-25: 1 mg via INTRAVENOUS
  Administered 2014-05-25: 2 mg via INTRAVENOUS
  Filled 2014-05-25: qty 2

## 2014-05-25 MED ORDER — FENTANYL CITRATE 0.05 MG/ML IJ SOLN
INTRAMUSCULAR | Status: AC
  Filled 2014-05-25: qty 5

## 2014-05-25 MED ORDER — HYDROMORPHONE HCL 1 MG/ML IJ SOLN
0.2500 mg | INTRAMUSCULAR | Status: DC | PRN
  Administered 2014-05-25: 0.5 mg via INTRAVENOUS

## 2014-05-25 MED ORDER — ACETAMINOPHEN 500 MG PO TABS
1000.0000 mg | ORAL_TABLET | Freq: Four times a day (QID) | ORAL | Status: DC
  Administered 2014-05-25 – 2014-05-30 (×18): 1000 mg via ORAL
  Filled 2014-05-25 (×25): qty 2

## 2014-05-25 MED ORDER — SODIUM CHLORIDE 0.9 % IV SOLN
10.0000 mg | INTRAVENOUS | Status: DC | PRN
  Administered 2014-05-25: 10 ug/min via INTRAVENOUS

## 2014-05-25 MED ORDER — ROCURONIUM BROMIDE 100 MG/10ML IV SOLN
INTRAVENOUS | Status: DC | PRN
  Administered 2014-05-25: 30 mg via INTRAVENOUS
  Administered 2014-05-25: 10 mg via INTRAVENOUS

## 2014-05-25 MED ORDER — MIDAZOLAM HCL 2 MG/2ML IJ SOLN
INTRAMUSCULAR | Status: AC
  Filled 2014-05-25: qty 2

## 2014-05-25 MED ORDER — 0.9 % SODIUM CHLORIDE (POUR BTL) OPTIME
TOPICAL | Status: DC | PRN
  Administered 2014-05-25: 1000 mL

## 2014-05-25 MED ORDER — ONDANSETRON HCL 4 MG/2ML IJ SOLN: 4.0000 mg | Freq: Four times a day (QID) | INTRAMUSCULAR | Status: DC | PRN

## 2014-05-25 MED ORDER — FENTANYL CITRATE 0.05 MG/ML IJ SOLN
INTRAMUSCULAR | Status: AC
  Administered 2014-05-25: 50 ug via INTRAVENOUS
  Administered 2014-05-25 (×3): 100 ug via INTRAVENOUS
  Administered 2014-05-25 (×3): 50 ug via INTRAVENOUS
  Filled 2014-05-25: qty 2

## 2014-05-25 MED ORDER — DEXAMETHASONE SODIUM PHOSPHATE 4 MG/ML IJ SOLN
INTRAMUSCULAR | Status: DC | PRN
  Administered 2014-05-25: 4 mg via INTRAVENOUS

## 2014-05-25 MED ORDER — PROMETHAZINE HCL 25 MG/ML IJ SOLN: 6.2500 mg | INTRAMUSCULAR | Status: DC | PRN

## 2014-05-25 MED ORDER — ALBUTEROL SULFATE (2.5 MG/3ML) 0.083% IN NEBU: 2.5000 mg | INHALATION_SOLUTION | RESPIRATORY_TRACT | Status: DC | PRN

## 2014-05-25 MED ORDER — IPRATROPIUM-ALBUTEROL 0.5-2.5 (3) MG/3ML IN SOLN: 3.0000 mL | Freq: Three times a day (TID) | RESPIRATORY_TRACT | Status: DC

## 2014-05-25 MED ORDER — ACETAMINOPHEN 160 MG/5ML PO SOLN
1000.0000 mg | Freq: Four times a day (QID) | ORAL | Status: DC
  Filled 2014-05-25: qty 40

## 2014-05-25 MED ORDER — POTASSIUM CHLORIDE IN NACL 20-0.9 MEQ/L-% IV SOLN
INTRAVENOUS | Status: DC
  Administered 2014-05-25: 17:00:00 via INTRAVENOUS
  Filled 2014-05-25 (×5): qty 1000

## 2014-05-25 MED ORDER — ARTIFICIAL TEARS OP OINT
TOPICAL_OINTMENT | OPHTHALMIC | Status: DC | PRN
  Administered 2014-05-25: 1 via OPHTHALMIC

## 2014-05-25 MED ORDER — LACTATED RINGERS IV SOLN
INTRAVENOUS | Status: DC | PRN
  Administered 2014-05-25: 13:00:00 via INTRAVENOUS

## 2014-05-25 MED ORDER — NEOSTIGMINE METHYLSULFATE 10 MG/10ML IV SOLN
INTRAVENOUS | Status: AC
  Filled 2014-05-25: qty 1

## 2014-05-25 MED ORDER — POTASSIUM CHLORIDE 10 MEQ/50ML IV SOLN
10.0000 meq | Freq: Every day | INTRAVENOUS | Status: DC | PRN
  Filled 2014-05-25: qty 50

## 2014-05-25 MED ORDER — TRAMADOL HCL 50 MG PO TABS
50.0000 mg | ORAL_TABLET | Freq: Four times a day (QID) | ORAL | Status: DC | PRN
  Administered 2014-05-25 – 2014-05-27 (×3): 100 mg via ORAL
  Filled 2014-05-25 (×4): qty 2

## 2014-05-25 MED ORDER — ALBUTEROL SULFATE (2.5 MG/3ML) 0.083% IN NEBU: 2.5000 mg | INHALATION_SOLUTION | RESPIRATORY_TRACT | Status: DC

## 2014-05-25 MED ORDER — LIDOCAINE HCL (CARDIAC) 20 MG/ML IV SOLN
INTRAVENOUS | Status: DC | PRN
  Administered 2014-05-25: 80 mg via INTRAVENOUS

## 2014-05-25 MED ORDER — ACETAMINOPHEN 325 MG PO TABS: 650.0000 mg | ORAL_TABLET | ORAL | Status: DC | PRN

## 2014-05-25 MED ORDER — LACTATED RINGERS IV SOLN
INTRAVENOUS | Status: DC | PRN
  Administered 2014-05-25 (×2): via INTRAVENOUS

## 2014-05-25 MED ORDER — ALBUTEROL SULFATE (2.5 MG/3ML) 0.083% IN NEBU: 2.5000 mg | INHALATION_SOLUTION | Freq: Three times a day (TID) | RESPIRATORY_TRACT | Status: DC

## 2014-05-25 MED ORDER — IPRATROPIUM BROMIDE 0.02 % IN SOLN: 0.5000 mg | RESPIRATORY_TRACT | Status: DC

## 2014-05-25 MED ORDER — NEOSTIGMINE METHYLSULFATE 10 MG/10ML IV SOLN
INTRAVENOUS | Status: DC | PRN
  Administered 2014-05-25: 4 mg via INTRAVENOUS

## 2014-05-25 MED ORDER — SENNOSIDES-DOCUSATE SODIUM 8.6-50 MG PO TABS
1.0000 | ORAL_TABLET | Freq: Every day | ORAL | Status: DC
  Administered 2014-05-25 – 2014-05-28 (×3): 1 via ORAL
  Filled 2014-05-25 (×7): qty 1

## 2014-05-25 MED ORDER — ROCURONIUM BROMIDE 50 MG/5ML IV SOLN
INTRAVENOUS | Status: AC
Start: 2014-05-25 — End: 2014-05-25
  Filled 2014-05-25: qty 1

## 2014-05-25 MED ORDER — ONDANSETRON HCL 4 MG/2ML IJ SOLN
INTRAMUSCULAR | Status: AC
  Filled 2014-05-25: qty 2

## 2014-05-25 MED ORDER — SUCCINYLCHOLINE CHLORIDE 20 MG/ML IJ SOLN
INTRAMUSCULAR | Status: DC | PRN
  Administered 2014-05-25: 100 mg via INTRAVENOUS

## 2014-05-25 MED ORDER — LIDOCAINE HCL (CARDIAC) 20 MG/ML IV SOLN
INTRAVENOUS | Status: AC
  Filled 2014-05-25: qty 5

## 2014-05-25 MED ORDER — BISACODYL 5 MG PO TBEC
10.0000 mg | DELAYED_RELEASE_TABLET | Freq: Every day | ORAL | Status: DC
  Administered 2014-05-26 – 2014-05-29 (×4): 10 mg via ORAL
  Filled 2014-05-25 (×4): qty 2

## 2014-05-25 MED ORDER — PHENYLEPHRINE HCL 10 MG/ML IJ SOLN
INTRAMUSCULAR | Status: AC
  Filled 2014-05-25: qty 1

## 2014-05-25 MED ORDER — MIDAZOLAM HCL 2 MG/2ML IJ SOLN
0.5000 mg | INTRAMUSCULAR | Status: DC | PRN
  Administered 2014-05-25: 0.5 mg via INTRAVENOUS

## 2014-05-25 MED ORDER — ONDANSETRON HCL 4 MG/2ML IJ SOLN
INTRAMUSCULAR | Status: DC | PRN
  Administered 2014-05-25: 4 mg via INTRAVENOUS

## 2014-05-25 MED ORDER — OXYCODONE HCL 5 MG PO TABS: 5.0000 mg | ORAL_TABLET | ORAL | Status: DC | PRN

## 2014-05-25 MED ORDER — PROPOFOL 10 MG/ML IV BOLUS
INTRAVENOUS | Status: AC
  Filled 2014-05-25: qty 20

## 2014-05-25 MED ORDER — GLYCOPYRROLATE 0.2 MG/ML IJ SOLN
INTRAMUSCULAR | Status: DC | PRN
  Administered 2014-05-25: 0.6 mg via INTRAVENOUS

## 2014-05-25 MED ORDER — GLYCOPYRROLATE 0.2 MG/ML IJ SOLN
INTRAMUSCULAR | Status: AC
  Filled 2014-05-25: qty 3

## 2014-05-25 MED ORDER — DEXAMETHASONE SODIUM PHOSPHATE 4 MG/ML IJ SOLN
INTRAMUSCULAR | Status: AC
  Filled 2014-05-25: qty 1

## 2014-05-25 MED ORDER — PROPOFOL 10 MG/ML IV BOLUS
INTRAVENOUS | Status: DC | PRN
  Administered 2014-05-25: 160 mg via INTRAVENOUS

## 2014-05-25 SURGICAL SUPPLY — 73 items
ADH SKN CLS APL DERMABOND .7 (GAUZE/BANDAGES/DRESSINGS)
ADH SKN CLS LQ APL DERMABOND (GAUZE/BANDAGES/DRESSINGS) ×2
APL SRG 22X2 LUM MLBL SLNT (VASCULAR PRODUCTS)
APPLICATOR TIP EXT COSEAL (VASCULAR PRODUCTS) IMPLANT
APPLIER CLIP ROT 10 11.4 M/L (STAPLE)
APR CLP MED LRG 11.4X10 (STAPLE)
CANISTER SUCTION 2500CC (MISCELLANEOUS) ×3 IMPLANT
CATH KIT ON Q 5IN SLV (PAIN MANAGEMENT) IMPLANT
CATH THORACIC 28FR (CATHETERS) IMPLANT
CATH THORACIC 36FR (CATHETERS) IMPLANT
CATH THORACIC 36FR RT ANG (CATHETERS) IMPLANT
CLIP APPLIE ROT 10 11.4 M/L (STAPLE) IMPLANT
CLIP TI MEDIUM 6 (CLIP) ×3 IMPLANT
CONN ST 1/4X3/8  BEN (MISCELLANEOUS) ×1
CONN ST 1/4X3/8 BEN (MISCELLANEOUS) ×1 IMPLANT
CONT SPEC 4OZ CLIKSEAL STRL BL (MISCELLANEOUS) ×8 IMPLANT
COVER SURGICAL LIGHT HANDLE (MISCELLANEOUS) ×3 IMPLANT
DERMABOND ADHESIVE PROPEN (GAUZE/BANDAGES/DRESSINGS) ×1
DERMABOND ADVANCED (GAUZE/BANDAGES/DRESSINGS)
DERMABOND ADVANCED .7 DNX12 (GAUZE/BANDAGES/DRESSINGS) IMPLANT
DERMABOND ADVANCED .7 DNX6 (GAUZE/BANDAGES/DRESSINGS) ×1 IMPLANT
DRAIN CHANNEL 32F RND 10.7 FF (WOUND CARE) ×4 IMPLANT
DRAPE LAPAROSCOPIC ABDOMINAL (DRAPES) ×3 IMPLANT
DRAPE SLUSH/WARMER DISC (DRAPES) ×2 IMPLANT
ELECT BLADE 6.5 EXT (BLADE) ×2 IMPLANT
ELECT REM PT RETURN 9FT ADLT (ELECTROSURGICAL) ×3
ELECTRODE REM PT RTRN 9FT ADLT (ELECTROSURGICAL) ×2 IMPLANT
FLUID NSS /IRRIG 3000 ML XXX (IV SOLUTION) ×3 IMPLANT
GAUZE SPONGE 4X4 12PLY STRL (GAUZE/BANDAGES/DRESSINGS) ×3 IMPLANT
GLOVE EUDERMIC 7 POWDERFREE (GLOVE) ×6 IMPLANT
GOWN STRL REUS W/ TWL LRG LVL3 (GOWN DISPOSABLE) ×4 IMPLANT
GOWN STRL REUS W/TWL LRG LVL3 (GOWN DISPOSABLE) ×6
HANDLE STAPLE ENDO GIA SHORT (STAPLE) ×1
KIT BASIN OR (CUSTOM PROCEDURE TRAY) ×3 IMPLANT
KIT ROOM TURNOVER OR (KITS) ×3 IMPLANT
NS IRRIG 1000ML POUR BTL (IV SOLUTION) ×12 IMPLANT
PACK CHEST (CUSTOM PROCEDURE TRAY) ×3 IMPLANT
PAD ARMBOARD 7.5X6 YLW CONV (MISCELLANEOUS) ×6 IMPLANT
RELOAD STAPLE 45 PURP MED/THCK (STAPLE) IMPLANT
RELOAD TRI 45 ART MED THCK PUR (STAPLE) ×21 IMPLANT
RELOAD TRI 60 ART MED THCK PUR (STAPLE) ×2 IMPLANT
SEALANT SURG COSEAL 4ML (VASCULAR PRODUCTS) IMPLANT
SEALANT SURG COSEAL 8ML (VASCULAR PRODUCTS) IMPLANT
SET IRRIG TUBING LAPAROSCOPIC (IRRIGATION / IRRIGATOR) ×3 IMPLANT
SOLUTION ANTI FOG 6CC (MISCELLANEOUS) ×3 IMPLANT
SPONGE GAUZE 4X4 12PLY STER LF (GAUZE/BANDAGES/DRESSINGS) ×2 IMPLANT
STAPLER ENDO GIA 12 SHRT THIN (STAPLE) IMPLANT
STAPLER ENDO GIA 12MM SHORT (STAPLE) ×2 IMPLANT
SUT MNCRL AB 3-0 PS2 18 (SUTURE) ×2 IMPLANT
SUT PROLENE 3 0 SH DA (SUTURE) IMPLANT
SUT PROLENE 4 0 RB 1 (SUTURE)
SUT PROLENE 4-0 RB1 .5 CRCL 36 (SUTURE) IMPLANT
SUT SILK  1 MH (SUTURE) ×5
SUT SILK 1 MH (SUTURE) ×7 IMPLANT
SUT SILK 1 TIES 10X30 (SUTURE) IMPLANT
SUT SILK 2 0SH CR/8 30 (SUTURE) IMPLANT
SUT VIC AB 1 CTX 18 (SUTURE) IMPLANT
SUT VIC AB 1 CTX 36 (SUTURE)
SUT VIC AB 1 CTX36XBRD ANBCTR (SUTURE) IMPLANT
SUT VIC AB 2-0 CT1 27 (SUTURE) ×3
SUT VIC AB 2-0 CT1 TAPERPNT 27 (SUTURE) ×1 IMPLANT
SUT VIC AB 2-0 CTX 36 (SUTURE) IMPLANT
SUT VIC AB 3-0 X1 27 (SUTURE) ×3 IMPLANT
SUT VICRYL 0 UR6 27IN ABS (SUTURE) ×4 IMPLANT
SUT VICRYL 2 TP 1 (SUTURE) IMPLANT
SYSTEM SAHARA CHEST DRAIN RE-I (WOUND CARE) ×3 IMPLANT
TAPE CLOTH 4X10 WHT NS (GAUZE/BANDAGES/DRESSINGS) ×3 IMPLANT
TAPE CLOTH SURG 4X10 WHT LF (GAUZE/BANDAGES/DRESSINGS) ×2 IMPLANT
TIP APPLICATOR SPRAY EXTEND 16 (VASCULAR PRODUCTS) IMPLANT
TOWEL OR 17X24 6PK STRL BLUE (TOWEL DISPOSABLE) ×6 IMPLANT
TOWEL OR 17X26 10 PK STRL BLUE (TOWEL DISPOSABLE) ×6 IMPLANT
TRAY FOLEY CATH 14FRSI W/METER (CATHETERS) ×3 IMPLANT
WATER STERILE IRR 1000ML POUR (IV SOLUTION) ×6 IMPLANT

## 2014-05-25 NOTE — Progress Notes (Signed)
Fentenyl and Versed pulled from [facility] for central line placement.  Lelon Perla CRNA took over pts care and the drugs that were pulled and will give/waste as needed.

## 2014-05-25 NOTE — Brief Op Note (Signed)
05/17/2014 - 05/25/2014  2:29 PM  PATIENT:  Michael Day  69 y.o. male  PRE-OPERATIVE DIAGNOSIS:  Spontaneous pneumothorax with prolonged air leak  POST-OPERATIVE DIAGNOSIS:  Spontaneous pneumothorax with prolonged air leak  PROCEDURE:  Procedure(s): VIDEO ASSISTED THORACOSCOPY (VATS) FOR BLEB RESECTION (Right)  SURGEON:  Surgeon(s) and Role:    * Rexene Alberts, MD - Primary  PHYSICIAN ASSISTANT:    * Erin Barrett, PA-C  ANESTHESIA:   general  EBL:  Total I/O In: 1000 [I.V.:1000] Out: 100 [Urine:50; Chest Tube:50]  BLOOD ADMINISTERED:none  DRAINS: 2 Chest Tube(s) in the right pleural space   LOCAL MEDICATIONS USED:  NONE  SPECIMEN:  Source of Specimen:  right lung apex x2  DISPOSITION OF SPECIMEN:  PATHOLOGY  COUNTS:  YES  TOURNIQUET:  * No tourniquets in log *  DICTATION: .Note written in EPIC  PLAN OF CARE: Admit to inpatient   PATIENT DISPOSITION:  PACU - hemodynamically stable.   Delay start of Pharmacological VTE agent (>24hrs) due to surgical blood loss or risk of bleeding: yes  OWEN,CLARENCE H 05/25/2014 2:31 PM

## 2014-05-25 NOTE — Anesthesia Preprocedure Evaluation (Addendum)
Anesthesia Evaluation  Patient identified by MRN, date of birth, ID band Patient awake    Reviewed: Allergy & Precautions, NPO status , Patient's Chart, lab work & pertinent test results  History of Anesthesia Complications Negative for: history of anesthetic complications  Airway Mallampati: II   Neck ROM: Full    Dental   Pulmonary COPDCurrent Smoker,  breath sounds clear to auscultation+ rhonchi   + wheezing      Cardiovascular hypertension, + CAD Rhythm:Regular Rate:Normal     Neuro/Psych    GI/Hepatic GERD-  ,(+)     substance abuse  alcohol use,   Endo/Other    Renal/GU Renal InsufficiencyRenal disease     Musculoskeletal  (+) Arthritis -,   Abdominal   Peds  Hematology   Anesthesia Other Findings   Reproductive/Obstetrics                            Anesthesia Physical Anesthesia Plan  ASA: III  Anesthesia Plan: General   Post-op Pain Management:    Induction: Intravenous  Airway Management Planned: Double Lumen EBT  Additional Equipment: CVP and Arterial line  Intra-op Plan:   Post-operative Plan: Extubation in OR  Informed Consent: I have reviewed the patients History and Physical, chart, labs and discussed the procedure including the risks, benefits and alternatives for the proposed anesthesia with the patient or authorized representative who has indicated his/her understanding and acceptance.   Dental advisory given  Plan Discussed with:   Anesthesia Plan Comments:         Anesthesia Quick Evaluation

## 2014-05-25 NOTE — Op Note (Signed)
CARDIOTHORACIC SURGERY OPERATIVE NOTE  Date of Procedure:   05/25/2014  Preoperative Diagnosis:  Right Spontaneous Pneumothorax with Prolonged Air Leak  Postoperative Diagnosis:  same  Procedure:    Right Video-assisted Thoracoscopy for Apical Bleb Resection and Mechanical Pleurodesis with Pleurectomy   Surgeon:    Valentina Gu. Roxy Manns, MD  Assistant:    Ellwood Handler, PA-C  Anesthesia:    Rica Koyanagi, MD     BRIEF CLINICAL NOTE AND INDICATIONS FOR SURGERY  Patient is a 69 year old African-American male with history of COPD and long-standing tobacco abuse who was admitted to the hospital 05/17/2014 with right spontaneous pneumothorax. The patient has history of previous spontaneous pneumothorax on the left side in 2011.  The patient underwent right chest tube placement and was admitted to the hospital. Follow-up chest radiographs revealed excellent chest tube position with complete reexpansion of the right lung. Over the past 7 days the patient has remained in the hospital with large persistent air leak that has failed to resolve. Chest CT scan demonstrates the presence of apical blebs in both lung fields. The patient has been counseled regarding treatment alternatives and per 5-4 informed consent for the subsequent procedure as described.     DETAILS OF THE OPERATIVE PROCEDURE  The patient is brought to the operating room on the above mentioned date and placed in the supine position on the operating table. Central venous line and radial arterial line are placed by the anesthesia team. Intravenous antibiotics are administered and pneumatic sequential compression boots are placed on both lower extremities. General endotracheal anesthesia is induced uneventfully.  The patient was intubated using a dual lumen endotracheal tube. The patient is turned to the left lateral decubitus position.  An axillary roll is placed and the patient's right chest was prepared and draped in a sterile manner after  removal of the preexisting chest tube.  A small incision is made overlying the seventh intercostal space and the anterior axillary line. The incision is completed into the pleural space with electrocautery. A 11 mm port is passed through the incision and a thoracoscopic videoscope passed through the port.  The right chest is explored visually. The right lung is completely collapsed. There are no adhesions. One additional small incision is made posteriorly through the fifth intercostal space.  The original chest tube incision located anteriorly is enlarged. Through the total of 3 small port incisions the remainder of the procedure is completed.  The right lung is mobilized to carefully examine the apex where there is some obvious blebs noted at the apex including one that appears to have ruptured. Wedge resection of the apex of the right lung was performed using several fires of the endoscopic GIA stapling device with reinforcement to prevent long-standing air leak. A second wedge specimen is obtained located more anteriorly were other blebs are noted. The remainder of the lung is examined and no other obvious blebs are noted. Mechanical pleurodesis is performed by removing the entire parietal pleura from the anterior, lateral, and posterior walls of the right chest around the apex and down the sides approximately two thirds of the entire chest. Satisfactory hemostasis was ascertained.  The right pleural spaces drained using two 32 French Bard drains exited through the anterior and middle port incision. The posterior port incision was closed in multiple layers in routine fashion. The chest tubes are fixed to a close suction drainage device and the right lung ventilated. No residual air leak is noted.  The patient tolerated the procedure well, was  extubated in the operative room, and transported to the postanesthesia care in stable condition. All sponge instrument and needle counts are verified correct. There  were no intraoperative complications. Estimated blood loss was trivial.    Valentina Gu. Roxy Manns MD 05/25/2014 2:28 PM

## 2014-05-25 NOTE — Brief Op Note (Signed)
05/17/2014 - 05/25/2014  2:24 PM  PATIENT:  Michael Day  69 y.o. male  PRE-OPERATIVE DIAGNOSIS:  Spontaneous pneumothorax with prolonged air leak  POST-OPERATIVE DIAGNOSIS:  Spontaneous pneumothorax with prolonged air leak  PROCEDURE:  Procedure(s):  VIDEO ASSISTED THORACOSCOPY (VATS) FOR BLEB RESECTION (Right) -Wedge Resection RUL x 2   SURGEON:  Surgeon(s) and Role:    * Rexene Alberts, MD - Primary  PHYSICIAN ASSISTANT: Ellwood Handler PA-C  ANESTHESIA:   general  EBL:  Total I/O In: 1000 [I.V.:1000] Out: 100 [Urine:50; Chest Tube:50]  BLOOD ADMINISTERED:none  DRAINS: 32 Blake Drain x 2 in Right Chest   LOCAL MEDICATIONS USED:  NONE  SPECIMEN:  Source of Specimen:  Right Upper Lobe Wedge x 2  DISPOSITION OF SPECIMEN:  PATHOLOGY  COUNTS:  YES  TOURNIQUET:  * No tourniquets in log *  DICTATION: .Dragon Dictation  PLAN OF CARE: Admit to inpatient   PATIENT DISPOSITION:  ICU - extubated and stable.   Delay start of Pharmacological VTE agent (>24hrs) due to surgical blood loss or risk of bleeding: yes

## 2014-05-25 NOTE — Anesthesia Postprocedure Evaluation (Signed)
  Anesthesia Post-op Note  Patient: Michael Day  Procedure(s) Performed: Procedure(s): VIDEO ASSISTED THORACOSCOPY (VATS) ,RIGHT UPPER LOBE WEDGE RESECTION, BLEB RESECTION (Right)  Patient Location: PACU  Anesthesia Type: General   Level of Consciousness: awake, alert  and oriented  Airway and Oxygen Therapy: Patient Spontanous Breathing  Post-op Pain: mild  Post-op Assessment: Post-op Vital signs reviewed  Post-op Vital Signs: Reviewed  Last Vitals:  Filed Vitals:   05/25/14 1700  BP: 131/70  Pulse: 60  Temp:   Resp: 21    Complications: No apparent anesthesia complications

## 2014-05-25 NOTE — Progress Notes (Signed)
Patient ID: Michael Day, male   DOB: [birth year], 69 y.o.   MRN: [id number]  [facility] Evening Rounds  Stable after bleb resection today. Pain under control I don't see any air leak.

## 2014-05-25 NOTE — Transfer of Care (Signed)
Immediate Anesthesia Transfer of Care Note  Patient: Michael Day  Procedure(s) Performed: Procedure(s): VIDEO ASSISTED THORACOSCOPY (VATS) ,RIGHT UPPER LOBE WEDGE RESECTION, BLEB RESECTION (Right)  Patient Location: PACU  Anesthesia Type:General  Level of Consciousness: awake, alert , oriented and patient cooperative  Airway & Oxygen Therapy: Patient Spontanous Breathing and Patient connected to face mask oxygen  Post-op Assessment: Report given to PACU RN, Post -op Vital signs reviewed and stable and Patient moving all extremities X 4  Post vital signs: Reviewed and stable  Complications: No apparent anesthesia complications

## 2014-05-26 LAB — BASIC METABOLIC PANEL
ANION GAP: 5 (ref 5–15)
BUN: 22 mg/dL (ref 6–23)
CO2: 25 mmol/L (ref 19–32)
Calcium: 8.3 mg/dL — ABNORMAL LOW (ref 8.4–10.5)
Chloride: 102 mEq/L (ref 96–112)
Creatinine, Ser: 1.11 mg/dL (ref 0.50–1.35)
GFR calc non Af Amer: 67 mL/min — ABNORMAL LOW (ref >90)
GFR, EST AFRICAN AMERICAN: 78 mL/min — AB (ref >90)
Glucose, Bld: 108 mg/dL — ABNORMAL HIGH (ref 70–99)
Potassium: 4.7 mmol/L (ref 3.5–5.1)
Sodium: 132 mmol/L — ABNORMAL LOW (ref 135–145)

## 2014-05-26 LAB — POCT I-STAT 3, ART BLOOD GAS (G3+)
Acid-base deficit: 2 mmol/L (ref 0.0–2.0)
Bicarbonate: 24.1 mEq/L — ABNORMAL HIGH (ref 20.0–24.0)
O2 Saturation: 98 %
PCO2 ART: 44.7 mmHg (ref 35.0–45.0)
PO2 ART: 115 mmHg — AB (ref 80.0–100.0)
Patient temperature: 99.3
TCO2: 25 mmol/L (ref 0–100)
pH, Arterial: 7.342 — ABNORMAL LOW (ref 7.350–7.450)

## 2014-05-26 LAB — CBC
HCT: 37.7 % — ABNORMAL LOW (ref 39.0–52.0)
Hemoglobin: 12.6 g/dL — ABNORMAL LOW (ref 13.0–17.0)
MCH: 31.9 pg (ref 26.0–34.0)
MCHC: 33.4 g/dL (ref 30.0–36.0)
MCV: 95.4 fL (ref 78.0–100.0)
Platelets: 200 10*3/uL (ref 150–400)
RBC: 3.95 MIL/uL — AB (ref 4.22–5.81)
RDW: 13 % (ref 11.5–15.5)
WBC: 7.5 10*3/uL (ref 4.0–10.5)

## 2014-05-26 MED ORDER — IPRATROPIUM-ALBUTEROL 0.5-2.5 (3) MG/3ML IN SOLN: 3.0000 mL | Freq: Four times a day (QID) | RESPIRATORY_TRACT | Status: DC | PRN

## 2014-05-26 NOTE — Progress Notes (Signed)
1 Day Post-Op Procedure(s) (LRB): VIDEO ASSISTED THORACOSCOPY (VATS) ,RIGHT UPPER LOBE WEDGE RESECTION, BLEB RESECTION (Right) Subjective:  No complaints  Objective: Vital signs in last 24 hours: Temp:  [98.1 F (36.7 C)-99.8 F (37.7 C)] 98.7 F (37.1 C) (01/09 0700) Pulse Rate:  [58-93] 75 (01/09 0600) Cardiac Rhythm:  [-] Normal sinus rhythm (01/09 0400) Resp:  [14-25] 14 (01/09 0600) BP: (99-137)/(43-118) 122/57 mmHg (01/09 0500) SpO2:  [95 %-100 %] 96 % (01/09 0600) Arterial Line BP: (80-149)/(46-77) 111/54 mmHg (01/09 0600) Weight:  [81.693 kg (180 lb 1.6 oz)] 81.693 kg (180 lb 1.6 oz) (01/09 0600)  Hemodynamic parameters for last 24 hours:    Intake/Output from previous day: 01/08 0701 - 01/09 0700 In: 2283.3 [I.V.:2233.3; IV Piggyback:50] Out: 1180 [Urine:960; Blood:50; Chest Tube:170] Intake/Output this shift: Total I/O In: -  Out: 180 [Urine:150; Chest Tube:30]  General appearance: alert and cooperative Heart: regular rate and rhythm, S1, S2 normal, no murmur, click, rub or gallop Lungs: clear to auscultation bilaterally Chest tube has no air leak  Lab Results:  Recent Labs  05/24/14 1525 05/26/14 0400  WBC 5.9 7.5  HGB 14.1 12.6*  HCT 41.3 37.7*  PLT 205 200   BMET:  Recent Labs  05/24/14 1525 05/26/14 0400  NA 132* 132*  K 3.9 4.7  CL 103 102  CO2 22 25  GLUCOSE 126* 108*  BUN 29* 22  CREATININE 1.40* 1.11  CALCIUM 8.9 8.3*    PT/INR:  Recent Labs  05/24/14 1525  LABPROT 14.4  INR 1.11   ABG    Component Value Date/Time   PHART 7.342* 05/26/2014 0355   HCO3 24.1* 05/26/2014 0355   TCO2 25 05/26/2014 0355   ACIDBASEDEF 2.0 05/26/2014 0355   O2SAT 98.0 05/26/2014 0355   CBG (last 3)  No results for input(s): GLUCAP in the last 72 hours.  Assessment/Plan: S/P Procedure(s) (LRB): VIDEO ASSISTED THORACOSCOPY (VATS) ,RIGHT UPPER LOBE WEDGE RESECTION, BLEB RESECTION (Right)  He is doing well.  CT to water seal DC foley and  arterial line Mobilize, IS   LOS: 9 days    BARTLE,BRYAN K 05/26/2014

## 2014-05-26 NOTE — Progress Notes (Signed)
Patient ID: Michael Day, male   DOB: Apr 04, 1946, 69 y.o.   MRN: [id number] [facility] Evening Rounds:  Hemodynamically stable. No problems today.

## 2014-05-27 ENCOUNTER — Inpatient Hospital Stay (HOSPITAL_COMMUNITY): Payer: Commercial Managed Care - HMO

## 2014-05-27 LAB — COMPREHENSIVE METABOLIC PANEL
ALT: 35 U/L (ref 0–53)
ANION GAP: 5 (ref 5–15)
AST: 47 U/L — ABNORMAL HIGH (ref 0–37)
Albumin: 2.5 g/dL — ABNORMAL LOW (ref 3.5–5.2)
Alkaline Phosphatase: 75 U/L (ref 39–117)
BILIRUBIN TOTAL: 0.6 mg/dL (ref 0.3–1.2)
BUN: 15 mg/dL (ref 6–23)
CALCIUM: 8.6 mg/dL (ref 8.4–10.5)
CHLORIDE: 100 meq/L (ref 96–112)
CO2: 28 mmol/L (ref 19–32)
Creatinine, Ser: 1.02 mg/dL (ref 0.50–1.35)
GFR calc Af Amer: 86 mL/min — ABNORMAL LOW (ref >90)
GFR, EST NON AFRICAN AMERICAN: 75 mL/min — AB (ref >90)
Glucose, Bld: 97 mg/dL (ref 70–99)
POTASSIUM: 4.2 mmol/L (ref 3.5–5.1)
Sodium: 133 mmol/L — ABNORMAL LOW (ref 135–145)
Total Protein: 5.5 g/dL — ABNORMAL LOW (ref 6.0–8.3)

## 2014-05-27 LAB — CBC
HCT: 36.6 % — ABNORMAL LOW (ref 39.0–52.0)
Hemoglobin: 12 g/dL — ABNORMAL LOW (ref 13.0–17.0)
MCH: 31.6 pg (ref 26.0–34.0)
MCHC: 32.8 g/dL (ref 30.0–36.0)
MCV: 96.3 fL (ref 78.0–100.0)
Platelets: 202 10*3/uL (ref 150–400)
RBC: 3.8 MIL/uL — AB (ref 4.22–5.81)
RDW: 13.1 % (ref 11.5–15.5)
WBC: 8 10*3/uL (ref 4.0–10.5)

## 2014-05-27 NOTE — Progress Notes (Signed)
2 Days Post-Op Procedure(s) (LRB): VIDEO ASSISTED THORACOSCOPY (VATS) ,RIGHT UPPER LOBE WEDGE RESECTION, BLEB RESECTION (Right) Subjective:  No complaints  Objective: Vital signs in last 24 hours: Temp:  [98.3 F (36.8 C)-99.1 F (37.3 C)] 98.5 F (36.9 C) (01/10 0739) Pulse Rate:  [59-81] 69 (01/10 1100) Cardiac Rhythm:  [-] Normal sinus rhythm (01/10 0800) Resp:  [18-30] 24 (01/10 1100) BP: (92-120)/(42-69) 111/59 mmHg (01/10 1100) SpO2:  [93 %-99 %] 96 % (01/10 1100)  Hemodynamic parameters for last 24 hours:    Intake/Output from previous day: 01/09 0701 - 01/10 0700 In: 810 [P.O.:300; I.V.:510] Out: 4650 [Urine:1500; Chest Tube:230] Intake/Output this shift: Total I/O In: 20 [I.V.:20] Out: -   General appearance: alert and cooperative Heart: regular rate and rhythm, S1, S2 normal, no murmur, click, rub or gallop Lungs: clear to auscultation bilaterally subcutaneous air stable No air leak from chest tubes Lab Results:  Recent Labs  05/26/14 0400 05/27/14 0330  WBC 7.5 8.0  HGB 12.6* 12.0*  HCT 37.7* 36.6*  PLT 200 202   BMET:  Recent Labs  05/26/14 0400 05/27/14 0330  NA 132* 133*  K 4.7 4.2  CL 102 100  CO2 25 28  GLUCOSE 108* 97  BUN 22 15  CREATININE 1.11 1.02  CALCIUM 8.3* 8.6    PT/INR:  Recent Labs  05/24/14 1525  LABPROT 14.4  INR 1.11   ABG    Component Value Date/Time   PHART 7.342* 05/26/2014 0355   HCO3 24.1* 05/26/2014 0355   TCO2 25 05/26/2014 0355   ACIDBASEDEF 2.0 05/26/2014 0355   O2SAT 98.0 05/26/2014 0355   CBG (last 3)  No results for input(s): GLUCAP in the last 72 hours.  CLINICAL DATA: Pneumothorax, right chest tube  EXAM: PORTABLE CHEST - 1 VIEW  COMPARISON: 05/25/2014  FINDINGS: Right apical chest tube. No definite pneumothorax is seen.  Extensive subcutaneous emphysema in the bilateral neck and chest wall.  Underlying lungs are clear. No pleural effusions.  The heart is normal in  size.  Right subclavian venous catheter terminates in the mid SVC.  IMPRESSION: Right apical chest tube. No definite pneumothorax is seen.  Extensive subcutaneous emphysema in the bilateral neck and chest wall.   Electronically Signed  By: Julian Hy M.D.  On: 05/27/2014 09:21  Assessment/Plan: S/P Procedure(s) (LRB): VIDEO ASSISTED THORACOSCOPY (VATS) ,RIGHT UPPER LOBE WEDGE RESECTION, BLEB RESECTION (Right)  No air leak. Will remove posterior tube today and anterior tube tomorrow. Continue IS and ambulation   LOS: 10 days    Michael Day K 05/27/2014

## 2014-05-28 ENCOUNTER — Encounter (HOSPITAL_COMMUNITY): Payer: Self-pay | Admitting: Thoracic Surgery (Cardiothoracic Vascular Surgery)

## 2014-05-28 ENCOUNTER — Inpatient Hospital Stay (HOSPITAL_COMMUNITY): Payer: Commercial Managed Care - HMO

## 2014-05-28 NOTE — Progress Notes (Signed)
[facility] BRIEF SICU PROGRESS NOTE  3 Days Post-Op  S/P Procedure(s) (LRB): VIDEO ASSISTED THORACOSCOPY (VATS) ,RIGHT UPPER LOBE WEDGE RESECTION, BLEB RESECTION (Right)   Still waiting for bed on 2W for transfer   Plan: Continue current plan  Mera Gunkel H 05/28/2014 8:59 PM

## 2014-05-28 NOTE — Progress Notes (Signed)
UR Completed.  [phone]  

## 2014-05-28 NOTE — Progress Notes (Addendum)
T[facility] DAILY ICU PROGRESS NOTE                   [address]            [place] [address]          [phone]   3 Days Post-Op Procedure(s) (LRB): VIDEO ASSISTED THORACOSCOPY (VATS) ,RIGHT UPPER LOBE WEDGE RESECTION, BLEB RESECTION (Right)  Total Length of Stay:  LOS: 11 days   Subjective: Patient sitting in chair-no complaints.  Objective: Vital signs in last 24 hours: Temp:  [98.1 F (36.7 C)-99.7 F (37.6 C)] 98.4 F (36.9 C) (01/11 0400) Pulse Rate:  [58-95] 58 (01/11 0700) Cardiac Rhythm:  [-] Normal sinus rhythm (01/11 0600) Resp:  [9-29] 9 (01/11 0700) BP: (88-118)/(49-87) 96/70 mmHg (01/11 0700) SpO2:  [95 %-100 %] 100 % (01/11 0700) Weight:  [177 lb 4 oz (80.4 kg)] 177 lb 4 oz (80.4 kg) (01/11 0600)  Filed Weights   05/17/14 1651 05/26/14 0600 05/28/14 0600  Weight: 183 lb 13.8 oz (83.4 kg) 180 lb 1.6 oz (81.693 kg) 177 lb 4 oz (80.4 kg)      Intake/Output from previous day: 01/10 0701 - 01/11 0700 In: 950 [P.O.:810; I.V.:140] Out: 1450 [Urine:1450]     Current Meds: Scheduled Meds: . acetaminophen  1,000 mg Oral 4 times per day   Or  . acetaminophen (TYLENOL) oral liquid 160 mg/5 mL  1,000 mg Oral 4 times per day  . antiseptic oral rinse  7 mL Mouth Rinse BID  . bisacodyl  10 mg Oral Daily  . dextromethorphan-guaiFENesin  1 tablet Oral BID  . docusate sodium  200 mg Oral Daily  . folic acid  1 mg Oral Daily  . isosorbide mononitrate  15 mg Oral Daily  . lisinopril  20 mg Oral Daily  . pantoprazole  40 mg Oral Daily  . pneumococcal 23 valent vaccine  0.5 mL Intramuscular Tomorrow-1000  . propranolol  40 mg Oral BID  . senna-docusate  1 tablet Oral QHS  . thiamine  100 mg Oral Daily   Continuous Infusions: . 0.9 % NaCl with KCl 20 mEq / L 10 mL/hr at 05/27/14 1200   PRN Meds:.[START ON 05/30/2014] acetaminophen, albuterol, ipratropium-albuterol, morphine injection, ondansetron (ZOFRAN) IV, potassium chloride, traMADol  Heart:  regular rate and rhythm Lungs: Clear on the left and coarse on the right Abdomen: soft, non-tender; bowel sounds normal; no masses,  no organomegaly Wound: Dressing is clean and dry  Lab Results: CBC:  Recent Labs  05/26/14 0400 05/27/14 0330  WBC 7.5 8.0  HGB 12.6* 12.0*  HCT 37.7* 36.6*  PLT 200 202   BMET:   Recent Labs  05/26/14 0400 05/27/14 0330  NA 132* 133*  K 4.7 4.2  CL 102 100  CO2 25 28  GLUCOSE 108* 97  BUN 22 15  CREATININE 1.11 1.02  CALCIUM 8.3* 8.6    PT/INR: No results for input(s): LABPROT, INR in the last 72 hours. Radiology: Dg Chest Port 1 View  05/28/2014   CLINICAL DATA:  69 year old male with a history of spontaneous pneumothorax with prolonged air leak status post VATS with right upper lobe blebectomy.  EXAM: PORTABLE CHEST - 1 VIEW  COMPARISON:  Prior chest x-ray 05/27/2014  FINDINGS: Interval removal of 1 of the 2 right-sided thoracostomy tubes. Right subclavian central venous catheter remains in unchanged position with the tip overlying the upper SVC. No definite pneumothorax. Extensive subcutaneous emphysema persists in slightly limits  evaluation of the lung markings and the detection of small pneumothoraces. Persistent small right basilar pleural effusion versus pleural thickening. Cardiac and mediastinal contours remain unchanged. The left lung remains clear. No acute osseous abnormality.  IMPRESSION: 1. No definite pneumothorax identified following removal of 1 of the 2 right-sided chest tubes. 2. Remaining support apparatus remains in stable and satisfactory position. 3. Persistent extensive subcutaneous emphysema.   Electronically Signed   By: Jacqulynn Cadet M.D.   On: 05/28/2014 07:57   Dg Chest Port 1 View  05/27/2014   CLINICAL DATA:  Pneumothorax, right chest tube  EXAM: PORTABLE CHEST - 1 VIEW  COMPARISON:  05/25/2014  FINDINGS: Right apical chest tube.  No definite pneumothorax is seen.  Extensive subcutaneous emphysema in the bilateral  neck and chest wall.  Underlying lungs are clear.  No pleural effusions.  The heart is normal in size.  Right subclavian venous catheter terminates in the mid SVC.  IMPRESSION: Right apical chest tube.  No definite pneumothorax is seen.  Extensive subcutaneous emphysema in the bilateral neck and chest wall.   Electronically Signed   By: Julian Hy M.D.   On: 05/27/2014 09:21     Assessment/Plan: S/P Procedure(s) (LRB): VIDEO ASSISTED THORACOSCOPY (VATS) ,RIGHT UPPER LOBE WEDGE RESECTION, BLEB RESECTION (Right)  1.CV-SR in the 70's. BP labile this am. On Propanolol 40 bid, Norvasc 5 daily,Lisinopril 20, HCTZ 25 daily, and Imdur 15 daily. Parameters placed on Lisinopril and Propanolol. Will stop Norvasc and HCTZ for now. 2. Pulmonary-Chest tube output not recorded this am. Chest tube is to water seal and there is no air leak. CXR this am appears to show no pneumothorax, persistent subcutaneous emphysema bilateral neck and chest. Will likely remove remaining chest tube. Check CXR in am 3. Remove central line 4. Likely transfer to regular 2000   Nani Skillern PA-C 05/28/2014 8:01 AM  I have seen and examined the patient and agree with the assessment and plan as outlined.  Will leave chest tube on water seal today and plan to remove it tomorrow if CXR stable.  Transfer 2W tele  Rexene Alberts 05/28/2014 8:28 AM

## 2014-05-29 ENCOUNTER — Inpatient Hospital Stay (HOSPITAL_COMMUNITY): Payer: Commercial Managed Care - HMO

## 2014-05-29 NOTE — Discharge Summary (Signed)
Physician Discharge Summary       [address]       [place] [address]             [phone]    Patient ID: Michael Day MRN: [id number] DOB/AGE: 01/18/1946 69 y.o.  Admit date: 05/17/2014 Discharge date: 05/30/2014    Admission Diagnoses: 1. Right spontaneous pneumothorax 2. History of left spontaneous pneumothorax 3. History of tobacco abuse 4. History of COPD 5. History of CKD stage III (moderate) 6. History of hypertension 7. History of alcohol abuse   Discharge Diagnoses:  1. Right spontaneous pneumothorax 2. History of left spontaneous pneumothorax 3. History of tobacco abuse 4. History of COPD 5. History of CKD stage III (moderate) 6. History of hypertension 7. History of alcohol abuse    Procedure (s):  1. Right chest tube placement by Dr. Roxy Manns on 05/17/2014 2. Right Video-assisted Thoracoscopy for Apical Bleb Resection and Mechanical Pleurodesis with Pleurectomy by Dr. Roxy Manns on 05/25/2014   Pathology: 1. Lung, wedge biopsy/resection, Apex right lung - LUNG PARENCHYMA WITH ANTHRACOSIS, FIBROELASTOSIS AND INFLAMMATION. - INTRA-ALVEOLAR PIGMENT LADEN MACROPHAGES. - SUBPLEURAL BLEB(S). - THERE IS NO EVIDENCE OF MALIGNANCY. 2. Lung, wedge biopsy/resection, Right upper lobe wedge - LUNG PARENCHYMA WITH SUBPLEURAL INTERSTITIAL FIBROSIS. - INTRA-ALVEOLAR PIGMENT LADEN MACROPHAGES. - THERE IS NO EVIDENCE OF MALIGNANCY.   History of Presenting Illness: This is a 69 year old African-American male from [place] with history of coronary artery disease, hypertension, long-standing tobacco abuse, alcohol abuse, and previous left sided spontaneous pneumothorax treated with chest tube placement by Dr. Servando Snare in 2011. The patient states that he was in his usual state of good health until this past Monday when he developed sudden onset right-sided chest pain and shortness of breath. He states that he has a chronic cough, but symptoms of cough became  acutely exacerbated. Symptoms persisted all week and felt similar to how he recalled feeling in 2011 when he presented with left-sided spontaneous pneumothorax. He denies any recent history of trauma. He denies any previous history of spontaneous pneumothorax on the right side. He denies any productive cough, hemoptysis, or wheezing. He presented to the emergency department where chest radiography demonstrates complete collapse of the right lung with large right pneumothorax. Cardiothoracic surgical consultation was requested.  The patient lives alone locally in [place]. He has been retired since 2011. He lives a somewhat sedentary lifestyle. He drinks alcohol regularly and sometimes excessively and he admits that he has been drinking too much over the holidays. He has a long-standing history of tobacco abuse and has tried several means to find a way to quit smoking, all unsuccessfully. Dr. Roxy Manns placed a chest tube on 05/17/2014.Follow up chest x ray showed a small right pneumothorax.    Brief Hospital Course:  The patient had a persistent air leak as well as a small right apical pneumothorax. His chest tube was eventually placed to water seal. It was discussed with the patient that he may need surgery (right VATS, bleb resection). Patient wished to avoid surgery if possible. PFTS were done. He then developed worsening subcutaneous emphysema and the air leak persisted. As a result, he would require surgical intervention. He underwent a right VATS, apical bleb resection, mechanical pleurodesis, and pleurectomy on 05/25/2014 by Dr. Roxy Manns.   There was no air leak after surgery. Chest tubes were placed to water seal. A line and foley were removed early in his post operative course. Chest x rays showed no pneumothorax but extensive subcutaneous emphysema bilateral chest  and neck. One chest tube was removed on 05/27/2013. Remaining chest tube was removed 05/29/2014. He is ambulating on room air. He is tolerating a  diet and has had a bowel movement. He is felt surgically stable for discharge today.    Latest Vital Signs: Blood pressure 117/61, pulse 78, temperature 98.9 F (37.2 C), temperature source Oral, resp. rate 18, height 5' 8.5" (1.74 m), weight 175 lb 7.8 oz (79.6 kg), SpO2 97 %.  Physical Exam: Heart: regular rate and rhythm Lungs: Clear on the left and diminshed on the right. Subcutaneous emphysema mostly felt on right anterior chest and neck and decreased from prior. Abdomen: soft, non-tender; bowel sounds normal; no masses, no organomegaly Wound: Clean and dry    Discharge Condition:Stable  Recent laboratory studies:  Lab Results  Component Value Date   WBC 8.0 05/27/2014   HGB 12.0* 05/27/2014   HCT 36.6* 05/27/2014   MCV 96.3 05/27/2014   PLT 202 05/27/2014   Lab Results  Component Value Date   NA 133* 05/27/2014   K 4.2 05/27/2014   CL 100 05/27/2014   CO2 28 05/27/2014   CREATININE 1.02 05/27/2014   GLUCOSE 97 05/27/2014      Diagnostic Studies: Dg Chest 2 View  05/29/2014   CLINICAL DATA:  Spontaneous pneumothorax treated with chest tube placement.  EXAM: CHEST  2 VIEW  COMPARISON:  Portable chest x-ray of May 28, 2014  FINDINGS: The right chest tube is unchanged in position with the tip projecting over the extreme medial aspect of the right pleural space at the T5 level. No discrete pleural line is demonstrated. There is no significant pleural effusion. There is stable apical pleural thickening on the right. There is a large amount of subcutaneous emphysema bilaterally and extending into the neck. The mediastinum is not shifted. The heart is normal in size. The pulmonary vascularity is not engorged. The right-sided PICC line has been removed.  IMPRESSION: No definite pneumothorax or significant pleural effusion is noted on the right. The right chest tube is unchanged in position. There remains a large amount of subcutaneous emphysema bilaterally which extends into  the lower neck.   Electronically Signed   By: David  Martinique   On: 05/29/2014 07:22   Ct Chest Wo Contrast  05/19/2014   CLINICAL DATA:  Patient presented with spontaneous large right pneumothorax 2 days ago. CT requested to evaluate for emphysematous blebs in anticipation of possible bullectomy. Severe central chest pain. Right chest tube in place.  EXAM: CT CHEST WITHOUT CONTRAST  TECHNIQUE: Multidetector CT imaging of the chest was performed following the standard protocol without IV contrast.  COMPARISON:  No prior CT.  Multiple recent chest x-rays.  FINDINGS: Right chest tube in place with residual small (5-10% or so) right pneumothorax. Numerous blebs in both lung apices, the largest actually in the left lung apex medially. Emphysematous changes throughout both lungs. Linear atelectasis in the right lower lobe. Lungs otherwise clear without localized airspace consolidation, interstitial disease, or parenchymal nodules or masses. Very small right pleural effusion. No left pleural effusion.  Subcutaneous emphysema in the right chest wall. No significant mediastinal, hilar or axillary lymphadenopathy. Visualized thyroid gland unremarkable.  Heart size upper normal. Moderate to severe LAD and left circumflex coronary atherosclerosis with likely calcified and noncalcified plaque in the LAD. No pericardial effusion. 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 Mild atherosclerosis involving the aortic arch.  Cysts in the visualized portion of the left lobe of the liver. Allowing  for the unenhanced technique, no significant abnormalities in the visualized upper abdomen. Bone window images demonstrate mid and lower thoracic spondylosis.  IMPRESSION: 1. Numerous blebs in both lung apices. 2. Residual small (5-10% or so) right pneumothorax with right chest tube in place. 3. Mild atelectasis in the right lower lobe. No acute cardiopulmonary disease otherwise. 4. Subcutaneous emphysema in the right chest  wall. 5. Moderate to severe LAD and left circumflex coronary atherosclerosis with likely calcified and noncalcified plaque in the LAD. 6. 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 Recommend annual imaging followup by CTA or MRA. This recommendation follows 2010 ACCF/AHA/AATS/ACR/ASA/SCA/SCAI/SIR/STS/SVM Guidelines for the Diagnosis and Management of Patients With Thoracic Aortic Disease. Circulation. 2010; 121: G881-J031.   Electronically Signed   By: Evangeline Dakin M.D.   On: 05/19/2014 13:26    Discharge Medications:   Medication List    STOP taking these medications        amLODipine 5 MG tablet  Commonly known as:  NORVASC     hydrochlorothiazide 25 MG tablet  Commonly known as:  HYDRODIURIL      TAKE these medications        folic acid 1 MG tablet  Commonly known as:  FOLVITE  Take 1 tablet (1 mg total) by mouth daily.     isosorbide mononitrate 30 MG 24 hr tablet  Commonly known as:  IMDUR  Take 0.5 tablets (15 mg total) by mouth daily.     lisinopril 20 MG tablet  Commonly known as:  PRINIVIL,ZESTRIL  Take 1 tablet (20 mg total) by mouth daily.     naproxen sodium 220 MG tablet  Commonly known as:  ANAPROX  Take 440 mg by mouth daily as needed (for pain).     omeprazole 20 MG capsule  Commonly known as:  PRILOSEC  Take 1 capsule (20 mg total) by mouth daily.     propranolol 40 MG tablet  Commonly known as:  INDERAL  Take 1 tablet (40 mg total) by mouth 2 (two) times daily.     thiamine 100 MG tablet  Commonly known as:  VITAMIN B-1  Take 1 tablet (100 mg total) by mouth daily.     traMADol 50 MG tablet  Commonly known as:  ULTRAM  Take 1-2 tablets (50-100 mg total) by mouth every 6 (six) hours as needed (pain).         Follow Up Appointments: Follow-up Information    Follow up with Rexene Alberts, MD On 06/18/2014.   Specialty:  Cardiothoracic Surgery   Why:  Appointment with Dr. Roxy Manns is at 2:00 pm. Please have a chest x-ray at  [facility] (first floor) at 1:00.   Contact information:   [address]  [place] [address] [phone]       Follow up with [facility] NURSE On 06/07/2014.   Why:  For suture removal with Dr. Guy Sandifer nurse at 10:00      Signed: Gerell Fortson HPA-C 05/30/2014, 8:06 AM

## 2014-05-29 NOTE — Consult Note (Signed)
Met with the patient at bedside to offer [facility] services as benefit of his [employer]. Patient agreeable to services and will receive post hospital discharge call and will be evaluated for monthly home visits. Packet given with information about [facility], consent form signed and copy given.  Of note, [facility] Care Management services does not replace or interfere with any services that are arranged by inpatient case management or social work. For additional questions or referrals please contact, Natividad Brood, RN BSN CCM, [facility] Liaison at [phone].

## 2014-05-29 NOTE — Progress Notes (Signed)
Pt transferred via RN to 2W room three.  Pt alertx4, no c/o of pain.  All belongings with pt at transfer including glasses, puzzle books, robe, bedroom slippers, and personal healthcare information.  Report called to [facility].

## 2014-05-29 NOTE — Progress Notes (Addendum)
TCTS DAILY ICU PROGRESS NOTE                   [address]            [place] [address]          [phone]   4 Days Post-Op Procedure(s) (LRB): VIDEO ASSISTED THORACOSCOPY (VATS) ,RIGHT UPPER LOBE WEDGE RESECTION, BLEB RESECTION (Right)  Total Length of Stay:  LOS: 12 days   Subjective: Patient lying in bed. Hopes chest tube is removed today.  Objective: Vital signs in last 24 hours: Temp:  [98.3 F (36.8 C)-99.7 F (37.6 C)] 98.5 F (36.9 C) (01/12 0400) Pulse Rate:  [69-102] 102 (01/11 1300) Cardiac Rhythm:  [-] Normal sinus rhythm (01/12 0508) Resp:  [14-27] 23 (01/12 0508) BP: (92-119)/(55-83) 116/76 mmHg (01/12 0508) SpO2:  [92 %-98 %] 97 % (01/12 0508) Weight:  [177 lb 11.1 oz (80.6 kg)] 177 lb 11.1 oz (80.6 kg) (01/12 0508)  Filed Weights   05/26/14 0600 05/28/14 0600 05/29/14 0508  Weight: 180 lb 1.6 oz (81.693 kg) 177 lb 4 oz (80.4 kg) 177 lb 11.1 oz (80.6 kg)      Intake/Output from previous day: 01/11 0701 - 01/12 0700 In: 300 [P.O.:300] Out: 900 [Urine:900]     Current Meds: Scheduled Meds: . acetaminophen  1,000 mg Oral 4 times per day   Or  . acetaminophen (TYLENOL) oral liquid 160 mg/5 mL  1,000 mg Oral 4 times per day  . antiseptic oral rinse  7 mL Mouth Rinse BID  . bisacodyl  10 mg Oral Daily  . dextromethorphan-guaiFENesin  1 tablet Oral BID  . docusate sodium  200 mg Oral Daily  . folic acid  1 mg Oral Daily  . isosorbide mononitrate  15 mg Oral Daily  . lisinopril  20 mg Oral Daily  . pantoprazole  40 mg Oral Daily  . pneumococcal 23 valent vaccine  0.5 mL Intramuscular Tomorrow-1000  . propranolol  40 mg Oral BID  . senna-docusate  1 tablet Oral QHS  . thiamine  100 mg Oral Daily   Continuous Infusions:   PRN Meds:.[START ON 05/30/2014] acetaminophen, albuterol, ipratropium-albuterol, morphine injection, ondansetron (ZOFRAN) IV, potassium chloride, traMADol  Heart: regular rate and rhythm Lungs: Clear on the  left and coarse on the right. Subcutaneous emphysema mostly felt on right anterior chest and neck Abdomen: soft, non-tender; bowel sounds normal; no masses,  no organomegaly Wound: Dressing is clean and dry Chest tube: to water seal and no air leak  Lab Results: CBC:  Recent Labs  05/27/14 0330  WBC 8.0  HGB 12.0*  HCT 36.6*  PLT 202   BMET:   Recent Labs  05/27/14 0330  NA 133*  K 4.2  CL 100  CO2 28  GLUCOSE 97  BUN 15  CREATININE 1.02  CALCIUM 8.6    PT/INR: No results for input(s): LABPROT, INR in the last 72 hours. Radiology: Dg Chest 2 View  05/29/2014   CLINICAL DATA:  Spontaneous pneumothorax treated with chest tube placement.  EXAM: CHEST  2 VIEW  COMPARISON:  Portable chest x-ray of May 28, 2014  FINDINGS: The right chest tube is unchanged in position with the tip projecting over the extreme medial aspect of the right pleural space at the T5 level. No discrete pleural line is demonstrated. There is no significant pleural effusion. There is stable apical pleural thickening on the right. There is a large amount of subcutaneous emphysema bilaterally  and extending into the neck. The mediastinum is not shifted. The heart is normal in size. The pulmonary vascularity is not engorged. The right-sided PICC line has been removed.  IMPRESSION: No definite pneumothorax or significant pleural effusion is noted on the right. The right chest tube is unchanged in position. There remains a large amount of subcutaneous emphysema bilaterally which extends into the lower neck.   Electronically Signed   By: David  Martinique   On: 05/29/2014 07:22   Dg Chest Port 1 View  05/28/2014   CLINICAL DATA:  69 year old male with a history of spontaneous pneumothorax with prolonged air leak status post VATS with right upper lobe blebectomy.  EXAM: PORTABLE CHEST - 1 VIEW  COMPARISON:  Prior chest x-ray 05/27/2014  FINDINGS: Interval removal of 1 of the 2 right-sided thoracostomy tubes. Right  subclavian central venous catheter remains in unchanged position with the tip overlying the upper SVC. No definite pneumothorax. Extensive subcutaneous emphysema persists in slightly limits evaluation of the lung markings and the detection of small pneumothoraces. Persistent small right basilar pleural effusion versus pleural thickening. Cardiac and mediastinal contours remain unchanged. The left lung remains clear. No acute osseous abnormality.  IMPRESSION: 1. No definite pneumothorax identified following removal of 1 of the 2 right-sided chest tubes. 2. Remaining support apparatus remains in stable and satisfactory position. 3. Persistent extensive subcutaneous emphysema.   Electronically Signed   By: Jacqulynn Cadet M.D.   On: 05/28/2014 07:57     Assessment/Plan: S/P Procedure(s) (LRB): VIDEO ASSISTED THORACOSCOPY (VATS) ,RIGHT UPPER LOBE WEDGE RESECTION, BLEB RESECTION (Right)  1.CV-SR in the 80's. BP improved since medications adjusted yesterday. On Propanolol 40 bid, Lisinopril 20, and Imdur 15 daily.  2. Pulmonary-Chest tube output not recorded this am. Chest tube is to water seal and there is no air leak. CXR this am appears to show no pneumothorax, persistent subcutaneous emphysema bilateral neck and chest. Hope to remove remaining chest tube. Check CXR in am Pathology:1. Lung, wedge biopsy/resection, Apex right lung - LUNG PARENCHYMA WITH ANTHRACOSIS, FIBROELASTOSIS AND INFLAMMATION. - INTRA-ALVEOLAR PIGMENT LADEN MACROPHAGES. - SUBPLEURAL BLEB(S). - THERE IS NO EVIDENCE OF MALIGNANCY. 2. Lung, wedge biopsy/resection, Right upper lobe wedge - LUNG PARENCHYMA WITH SUBPLEURAL INTERSTITIAL FIBROSIS. - INTRA-ALVEOLAR PIGMENT LADEN MACROPHAGES. - THERE IS NO EVIDENCE OF MALIGNANCY. 3. Transfer to [facility] PA-C 05/29/2014 7:39 AM    I have seen and examined the patient and agree with the assessment and plan as outlined.  D/C chest tube today.  Tentatively for D/C  home tomorrow.  OWEN,CLARENCE H 05/29/2014 8:02 AM

## 2014-05-29 NOTE — Discharge Instructions (Signed)
ACTIVITY: ° °1.Increase activity slowly. °2.Walk daily and increase frequency and duration as tolerates. °3.May walk up steps. °4.No lifting more than ten pounds for two weeks. °5.No driving for two weeks. °6.Avoid straining. °7.STOP any activity that causes chest pain, shortness of breath, dizziness,sweating,  °   or excessive weakness. °8.Continue with breathing exercises daily. ° °DIET:  Low fat, Low salt  diet ° ° °WOUND: ° °1.May shower. °2.Clean wounds with mild soap and water.  Call the office at [phone] if any problems arise. ° °Thoracoscopy °Care After °Refer to this sheet in the next few weeks. These discharge instructions provide you with general information on caring for yourself after you leave the hospital. Your caregiver may also give you specific instructions. Your treatment has been planned according to the most current medical practices available, but unavoidable complications sometimes occur. If you have any problems or questions after discharge, call your caregiver. °HOME CARE INSTRUCTIONS  °· Remove the bandage (dressing) over your chest tube site as directed by your caregiver. °· It is normal to be sore for a couple weeks following surgery. See your caregiver if this seems to be getting worse rather than better. °· Only take over-the-counter or prescription medicines for pain, discomfort, or fever as directed by your caregiver. It is very important to take pain medicine when you need it so that you will cough and breathe deeply enough to clear mucus (phlegm) and expand your lungs. °· If it hurts to cough, hold a pillow against your chest when you cough. This may help with the discomfort. In spite of the discomfort, cough frequently, as this helps protect against getting an infection in your lung (pneumonia). °· Taking deep breaths keeps lungs inflated and protects against pneumonia. Most patients will go home with an incentive spirometer that encourages deep breathing. °· You may resume a  normal diet and activities as directed. °· Use showers for bathing until you see your caregiver, or as instructed. °· Change dressings if necessary or as directed. °· Avoid lifting or driving until you are instructed otherwise. °· Make an appointment to see your caregiver for stitch (suture) or staple removal when instructed. °· Do not travel by airplane for 2 weeks after the chest tube is removed. °SEEK MEDICAL CARE IF:  °· You are bleeding from your wounds. °· You have redness, swelling, or increasing pain in the wounds. °· Your heartbeat feels irregular or very fast. °· There is pus coming from your wounds. °· There is a bad smell coming from the wound or dressing. °SEEK IMMEDIATE MEDICAL CARE IF:  °· You have a fever. °· You develop a rash. °· You have difficulty breathing. °· You develop any reaction or side effects to medicines given. °· You develop lightheadedness or feel faint. °· You develop shortness of breath or chest pain. °MAKE SURE YOU:  °· Understand these instructions. °· Will watch your condition. °· Will get help right away if you are not doing well or get worse. °Document Released: 11/21/2004 Document Revised: 07/27/2011 Document Reviewed: 10/22/2010 °ExitCare® Patient Information ©2015 [employer]. This information is not intended to replace advice given to you by your health care provider. Make sure you discuss any questions you have with your health care provider. ° °

## 2014-05-30 ENCOUNTER — Inpatient Hospital Stay (HOSPITAL_COMMUNITY): Payer: Commercial Managed Care - HMO

## 2014-05-30 MED ORDER — TRAMADOL HCL 50 MG PO TABS: 50.0000 mg | ORAL_TABLET | Freq: Four times a day (QID) | ORAL | Status: DC | PRN

## 2014-05-30 NOTE — Progress Notes (Addendum)
       [address]       [place] [address]             [phone]          5 Days Post-Op Procedure(s) (LRB): VIDEO ASSISTED THORACOSCOPY (VATS) ,RIGHT UPPER LOBE WEDGE RESECTION, BLEB RESECTION (Right)  Subjective: Comfortable, breathing stable. No complaints.  Objective: Vital signs in last 24 hours: Patient Vitals for the past 24 hrs:  BP Temp Temp src Pulse Resp SpO2 Weight  05/30/14 0610 117/61 mmHg 98.9 F (37.2 C) Oral 78 18 97 % 175 lb 7.8 oz (79.6 kg)  05/29/14 2100 (!) 96/56 mmHg - - 77 - - -  05/29/14 2056 104/63 mmHg 98.5 F (36.9 C) Oral 80 20 99 % -  05/29/14 1616 116/64 mmHg 98.6 F (37 C) Oral 86 20 100 % -  05/29/14 1605 (!) 100/54 mmHg - - - (!) 23 - -  05/29/14 1500 - - - - 19 - -  05/29/14 1400 - - - - (!) 22 97 % -  05/29/14 1300 - - - - (!) 25 98 % -  05/29/14 1200 - - - - (!) 27 96 % -  05/29/14 1145 105/64 mmHg - - - 19 - -  05/29/14 1100 - 100 F (37.8 C) Oral - 18 - -  05/29/14 1000 - - - - (!) 23 98 % -  05/29/14 0900 - - - - 19 - -  05/29/14 0835 106/65 mmHg - - - (!) 22 97 % -  05/29/14 0800 - - - - 13 - -   Current Weight  05/30/14 175 lb 7.8 oz (79.6 kg)     Intake/Output from previous day: 01/12 0701 - 01/13 0700 In: 90 [P.O.:490] Out: 215 [Urine:200; Chest Tube:15]    PHYSICAL EXAM:  Heart: RRR Lungs: Clear Wound: Clean and dry     Lab Results: CBC:No results for input(s): WBC, HGB, HCT, PLT in the last 72 hours. BMET: No results for input(s): NA, K, CL, CO2, GLUCOSE, BUN, CREATININE, CALCIUM in the last 72 hours.  PT/INR: No results for input(s): LABPROT, INR in the last 72 hours.  CXR: stable, no ptx, decreased SQ air   Assessment/Plan: S/P Procedure(s) (LRB): VIDEO ASSISTED THORACOSCOPY (VATS) ,RIGHT UPPER LOBE WEDGE RESECTION, BLEB RESECTION (Right) CXR stable following CT removal Plan d/c home- instructions reviewed with patient.    LOS: 13 days    COLLINS,GINA H 05/30/2014  I  have seen and examined the patient and agree with the assessment and plan as outlined.  D/C home today  Alaiza Yau H 05/30/2014 9:00 AM

## 2014-05-30 NOTE — Progress Notes (Addendum)
05/30/2014 2:27 PM Discharge AVS meds taken today and those due this evening reviewed. Discussed specific instructions/restrictions related to the surgery pt. Had. Follow-up appointments and when to call md reviewed.  D/C IV and TELE.  Questions and concerns addressed.   D/C home per orders. [place]

## 2014-05-30 NOTE — Care Management Note (Signed)
    Page 1 of 1   05/30/2014     11:12:21 AM CARE MANAGEMENT NOTE 05/30/2014  Patient:  Michael Day, Michael Day   Account Number:  [id number]  Date Initiated:  05/22/2014  Documentation initiated by:  Marvetta Gibbons  Subjective/Objective Assessment:   Pt admitted with spont. pntx- chest tube placed     Action/Plan:   PTA pt lived at home alone   Anticipated DC Date:  05/30/2014   Anticipated DC Plan:  [place]  CM consult      Choice offered to / List presented to:             Status of service:  Completed, signed off Medicare Important Message given?  YES (If response is "NO", the following Medicare IM given date fields will be blank) Date Medicare IM given:  05/22/2014 Medicare IM given by:  Marvetta Gibbons Date Additional Medicare IM given:  05/30/2014 Additional Medicare IM given by:  Marvetta Gibbons  Discharge Disposition:  HOME/SELF CARE  Per UR Regulation:  Reviewed for med. necessity/level of care/duration of stay  If discussed at [place] of Stay Meetings, dates discussed:   05/22/2014    Comments:  Contact:  McConnell,Wanda Sister [phone]  05/30/14- 1030- Marvetta Gibbons RN, bSN [phone] Pt for d/c home today- spoke with pt at bedside- no needs noted.   05-28-14 9:30am [place] [phone] Post op VATS on 1-8.  Still with CT post op.  Plan for water seal today - possible removal tomorrow.  Tx to acute unit today.

## 2014-06-07 ENCOUNTER — Ambulatory Visit (INDEPENDENT_AMBULATORY_CARE_PROVIDER_SITE_OTHER): Payer: Self-pay

## 2014-06-07 DIAGNOSIS — Z4802 Encounter for removal of sutures: Secondary | ICD-10-CM

## 2014-06-07 DIAGNOSIS — J9383 Other pneumothorax: Secondary | ICD-10-CM

## 2014-06-07 NOTE — Progress Notes (Signed)
Removed 4 sutures from chest tube site. No signs of infection and patient tolerated well.

## 2014-06-13 ENCOUNTER — Other Ambulatory Visit: Payer: Self-pay | Admitting: Thoracic Surgery (Cardiothoracic Vascular Surgery)

## 2014-06-13 DIAGNOSIS — J9383 Other pneumothorax: Secondary | ICD-10-CM

## 2014-06-14 ENCOUNTER — Other Ambulatory Visit: Payer: Self-pay | Admitting: *Deleted

## 2014-06-14 DIAGNOSIS — G8918 Other acute postprocedural pain: Secondary | ICD-10-CM

## 2014-06-14 MED ORDER — TRAMADOL HCL 50 MG PO TABS: 50.0000 mg | ORAL_TABLET | Freq: Four times a day (QID) | ORAL | Status: AC | PRN

## 2014-06-14 NOTE — Telephone Encounter (Signed)
Michael Day has called and requested a pain med refill for Tramadol.  I said that a new Rx would be faxed to his pharmacy today and he understood.

## 2014-06-18 ENCOUNTER — Encounter: Payer: Self-pay | Admitting: Thoracic Surgery (Cardiothoracic Vascular Surgery)

## 2014-06-18 ENCOUNTER — Ambulatory Visit
Admission: RE | Admit: 2014-06-18 | Discharge: 2014-06-18 | Disposition: A | Discharge status: Home / Self Care | Payer: Commercial Managed Care - HMO | Source: Ambulatory Visit | Attending: Thoracic Surgery (Cardiothoracic Vascular Surgery) | Admitting: Thoracic Surgery (Cardiothoracic Vascular Surgery)

## 2014-06-18 ENCOUNTER — Ambulatory Visit (INDEPENDENT_AMBULATORY_CARE_PROVIDER_SITE_OTHER): Payer: Self-pay | Admitting: Thoracic Surgery (Cardiothoracic Vascular Surgery)

## 2014-06-18 VITALS — BP 100/72 | HR 59 | Resp 16 | Ht 68.5 in | Wt 175.0 lb

## 2014-06-18 DIAGNOSIS — J9383 Other pneumothorax: Secondary | ICD-10-CM

## 2014-06-18 DIAGNOSIS — Z9889 Other specified postprocedural states: Secondary | ICD-10-CM | POA: Insufficient documentation

## 2014-06-18 DIAGNOSIS — J939 Pneumothorax, unspecified: Secondary | ICD-10-CM

## 2014-06-18 NOTE — Progress Notes (Signed)
[address]       [place] [address]             [phone]     CARDIOTHORACIC SURGERY OFFICE NOTE  Referring Provider is Hoy Morn, MD PCP is Karren Cobble, MD   HPI:  Patient returns to the office today for routine follow-up status post right video assisted thoracoscopy for apical bleb resection and mechanical pleurodesis with apical pleurectomy on 05/25/2014 for right spontaneous pneumothorax with prolonged air leak in the setting of COPD and long-standing tobacco abuse.  His in-hospital postoperative recovery was uncomplicated and he was discharged home on the fourth postoperative day.  Today the patient is feeling very well overall.  He has been steadily feeling better since the procedure.  He has experienced some mild intermittent right sided soreness, but believes that it is now fully resolved.  He denies fever, chills, cough, and SOB.  He is sleeping and eating well.  He has been trying hard to quit smoking and understands that smoking cessation is critical to his future health.  He is smoking 4-5 cigarettes per day.  He has been using patches with relative success and has recently run out of his supply.  He is in the process of getting more.  He has also used Chantix in the past without success.      Current Outpatient Prescriptions  Medication Sig Dispense Refill  . folic acid (FOLVITE) 1 MG tablet Take 1 tablet (1 mg total) by mouth daily. 30 tablet 6  . isosorbide mononitrate (IMDUR) 30 MG 24 hr tablet Take 0.5 tablets (15 mg total) by mouth daily. 45 tablet 3  . lisinopril (PRINIVIL,ZESTRIL) 20 MG tablet Take 1 tablet (20 mg total) by mouth daily. 90 tablet 3  . omeprazole (PRILOSEC) 20 MG capsule Take 1 capsule (20 mg total) by mouth daily. 30 capsule 6  . propranolol (INDERAL) 40 MG tablet Take 1 tablet (40 mg total) by mouth 2 (two) times daily. 30 tablet 6  . thiamine (VITAMIN B-1) 100 MG tablet Take 1 tablet (100 mg total) by mouth  daily. 30 tablet 6  . traMADol (ULTRAM) 50 MG tablet Take 1-2 tablets (50-100 mg total) by mouth every 6 (six) hours as needed (pain). 40 tablet 0   No current facility-administered medications for this visit.      Physical Exam:   BP 100/72 mmHg  Pulse 59  Resp 16  Ht 5' 8.5" (1.74 m)  Wt 175 lb (79.379 kg)  BMI 26.22 kg/m2  SpO2 98%  General:  Well appearing in NAD.  Chest:   Breathing unlabored, Clear b/l  CV:   RRR, no murmur appreciated  Incisions:  Well healing  Abdomen:  Soft, NT, ND, +bs  Extremities:  Warm and well perfused without edema  Diagnostic Tests:  CHEST 2 VIEW  COMPARISON: PA and lateral chest x-ray of May 30, 2014  FINDINGS: The lungs are adequately inflated. There is no pneumothorax. The extensive subcutaneous air has been resorptive. There is stable apical pleural thickening on the right. The heart and pulmonary vascularity are normal. There is no pleural effusion. The bony thorax is unremarkable.  IMPRESSION: There is no recurrent pneumothorax nor other active cardiopulmonary disease.   Electronically Signed  By: David Martinique  On: 06/18/2014 13:49   Impression:  Mr. Goldman has improved clinically and is feeling very well.  He is able to perform all ADLs without SOB or impairment.   CXR  today shows no recurrence of pneumothorax   Plan:  Smoking ceasation The patient is cleared to perform work and ADLs without restriction He understands that he should not drive if he is in pain and/or is taking pain medication He will follow up on an as needed basis   Roxan Hockey, PA-S    I have seen and examined the patient and agree with the assessment and plan as outlined.  Valentina Gu. Roxy Manns, MD 06/18/2014 2:40 PM

## 2014-06-18 NOTE — Patient Instructions (Signed)
Patient may resume unrestricted physical activity without any particular limitations at this time.  The patient may return to driving an automobile as long as they are no longer requiring oral narcotic pain relievers during the daytime.  It would be wise to start driving only short distances during the daylight and gradually increase from there as they feel comfortable.    The patient should make every effort to stop smoking immediately and permanently.

## 2014-07-10 ENCOUNTER — Telehealth: Payer: Self-pay | Admitting: *Deleted

## 2014-07-10 NOTE — Telephone Encounter (Signed)
Call from pt stating he had been contacted by [facility].  Pt does not understand why he needs to see a nephrologist and has declined an appt at this time.  Pt's next avail appt is 09/01/14 with his new pcp,Dr.Klima.  Pt wishes to hold off on this referral at this time and discuss it at next visit. [facility] to make them aware, and was informed that pt will need to be referred back when he's ready. Despina Hidden Cassady2/23/201611:48 AM

## 2014-07-31 ENCOUNTER — Telehealth: Payer: Self-pay | Admitting: Internal Medicine

## 2014-07-31 NOTE — Telephone Encounter (Signed)
Called pt about Nephrology referral. He states that he has seen Dr. Vanita Panda (Dr. Marland KitchenC") a few days ago and they did some labs and will check his urine, states he has a "jug" of urine that he will take back tomorrow.  He stated he was feeling well and appreciated the phone call.   -Will await for records from [facility].    Michael Pais, MD 07/31/14. 11:07 AM

## 2014-08-09 ENCOUNTER — Other Ambulatory Visit: Payer: Self-pay | Admitting: Internal Medicine

## 2014-08-09 ENCOUNTER — Encounter: Payer: Self-pay | Admitting: Cardiovascular Disease

## 2014-08-09 ENCOUNTER — Ambulatory Visit (INDEPENDENT_AMBULATORY_CARE_PROVIDER_SITE_OTHER): Payer: Commercial Managed Care - HMO | Admitting: Cardiovascular Disease

## 2014-08-09 VITALS — BP 100/72 | HR 84 | Ht 68.5 in | Wt 171.8 lb

## 2014-08-09 DIAGNOSIS — I2510 Atherosclerotic heart disease of native coronary artery without angina pectoris: Secondary | ICD-10-CM | POA: Diagnosis not present

## 2014-08-09 DIAGNOSIS — I2583 Coronary atherosclerosis due to lipid rich plaque: Secondary | ICD-10-CM

## 2014-08-09 DIAGNOSIS — E785 Hyperlipidemia, unspecified: Secondary | ICD-10-CM | POA: Diagnosis not present

## 2014-08-09 DIAGNOSIS — I25118 Atherosclerotic heart disease of native coronary artery with other forms of angina pectoris: Secondary | ICD-10-CM

## 2014-08-09 DIAGNOSIS — J939 Pneumothorax, unspecified: Secondary | ICD-10-CM

## 2014-08-09 DIAGNOSIS — I10 Essential (primary) hypertension: Secondary | ICD-10-CM | POA: Diagnosis not present

## 2014-08-09 NOTE — Assessment & Plan Note (Signed)
Cholesterol is at goal.  Continue current dose of statin and diet Rx.  No myalgias or side effects.  F/U  LFT's in 6 months. Lab Results  Component Value Date   LDLCALC 111* 03/27/2013

## 2014-08-09 NOTE — Patient Instructions (Signed)
Your physician wants you to follow-up in:  6 MONTHS WITH DR NISHAN  You will receive a reminder letter in the mail two months in advance. If you don't receive a letter, please call our office to schedule the follow-up appointment. Your physician recommends that you continue on your current medications as directed. Please refer to the Current Medication list given to you today. 

## 2014-08-09 NOTE — Assessment & Plan Note (Signed)
Well controlled.  Continue current medications and low sodium Dash type diet.    

## 2014-08-09 NOTE — Progress Notes (Signed)
Patient ID: Michael Day, male   DOB: 1946-02-17, 69 y.o.   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  Medical RX  EF normal Echo 1/14 no valve disease  Started on nitrates post cath in addition to beta blocker  Compliant with meds no recurrent chest pain  Discussed not taking viagra with imdur.  Also counseled on smoking cessation and importance  In halting progression of now known disease.  He seems non noncommitted to quit  05/30/14  D/C after RUL wedge resection by Dr Roxy Manns for pneumo and blebs     ROS: Denies fever, malais, weight loss, blurry vision, decreased visual acuity, cough, sputum, SOB, hemoptysis, pleuritic pain, palpitaitons, heartburn, abdominal pain, melena, lower extremity edema, claudication, or rash.  All other systems reviewed and negative  General: Affect appropriate Healthy:  appears stated age 69: normal Neck supple with no adenopathy JVP normal no bruits no thyromegaly Lungs clear with no wheezing  Video assisted right lateral lung scars and good diaphragmatic motion Heart:  S1/S2 no murmur, no rub, gallop or click PMI normal Abdomen: benighn, BS positve, no tenderness, no AAA no bruit.  No HSM or HJR Distal pulses intact with no bruits No edema Neuro non-focal Skin warm and dry No muscular weakness   Current Outpatient Prescriptions  Medication Sig Dispense Refill  . folic acid (FOLVITE) 1 MG tablet Take 1 tablet (1 mg total) by mouth daily. 30 tablet 6  . isosorbide mononitrate (IMDUR) 30 MG 24 hr tablet Take 0.5 tablets (15 mg total) by mouth daily. 45 tablet 3  . lisinopril (PRINIVIL,ZESTRIL) 20 MG tablet Take 1 tablet (20 mg total) by mouth daily. 90 tablet 3  . omeprazole (PRILOSEC) 20 MG capsule Take 1 capsule (20 mg total) by mouth daily. 30 capsule 6  . propranolol (INDERAL) 40 MG tablet Take 1 tablet (40 mg total) by mouth 2 (two) times  daily. 30 tablet 6  . thiamine (VITAMIN B-1) 100 MG tablet Take 1 tablet (100 mg total) by mouth daily. 30 tablet 6  . traMADol (ULTRAM) 50 MG tablet Take 1-2 tablets (50-100 mg total) by mouth every 6 (six) hours as needed (pain). 40 tablet 0   No current facility-administered medications for this visit.    Allergies  Review of patient's allergies indicates no known allergies.  Electrocardiogram:  SR rate 59  Normal   Assessment and Plan

## 2014-08-09 NOTE — Assessment & Plan Note (Signed)
F/U Dr Roxy Manns.  Post surgical resection of bleb  Improved Discussed smoking cessation Not motivated to quit

## 2014-08-09 NOTE — Assessment & Plan Note (Signed)
Stable with no angina and good activity level.  Continue medical Rx Has not needed nitro and no complications during recent lung surgery

## 2014-08-13 ENCOUNTER — Other Ambulatory Visit: Payer: Self-pay | Admitting: Internal Medicine

## 2014-08-13 ENCOUNTER — Telehealth: Payer: Self-pay | Admitting: *Deleted

## 2014-08-13 DIAGNOSIS — I10 Essential (primary) hypertension: Secondary | ICD-10-CM

## 2014-08-13 MED ORDER — AMLODIPINE BESYLATE 5 MG PO TABS: 5.0000 mg | ORAL_TABLET | Freq: Every day | ORAL | Status: AC

## 2014-08-13 NOTE — Telephone Encounter (Signed)
If he has been taking this medication and doing well it should be continued.  He was seen 4 days ago by Cardiology and was doing well at that time, so I would like him to continue.  I electronically prescribed the medications.  Please inform him that it has been renewed.  I will reassess him when I meet him on 08/31/2014.

## 2014-08-13 NOTE — Telephone Encounter (Signed)
Spoke w/ pt he will continue the amlodipine and HCTZ

## 2014-08-13 NOTE — Telephone Encounter (Signed)
Pt calls and states he needs a refill on amlodipine, reviewed his chart, at disch in jan 2016 he was ask to stop amlodipine and hctz, he states he didn't know he was supposed to do so and has continued to take both meds, he states he is not having any weakness, dizziness, no balance issues unless he smokes too much. He has an appt with you 4/15 but should he continue these 2 meds until the appt? Please advise.

## 2014-08-16 ENCOUNTER — Telehealth: Payer: Self-pay | Admitting: *Deleted

## 2014-08-16 NOTE — Telephone Encounter (Signed)
Pt was found at home this am deceased, per EMS

## 2014-08-17 DEATH — deceased

## 2014-08-31 ENCOUNTER — Encounter: Payer: Commercial Managed Care - HMO | Admitting: Internal Medicine

## 2014-09-16 DEATH — deceased

## 2015-04-26 IMAGING — DX DG CHEST 2V
2 series · 2 of 2 positions shown · non-contrast
Comparison: 05/29/2014

CLINICAL DATA: Assess for pneumothorax

EXAM:
CHEST  2 VIEW

[chest pa]
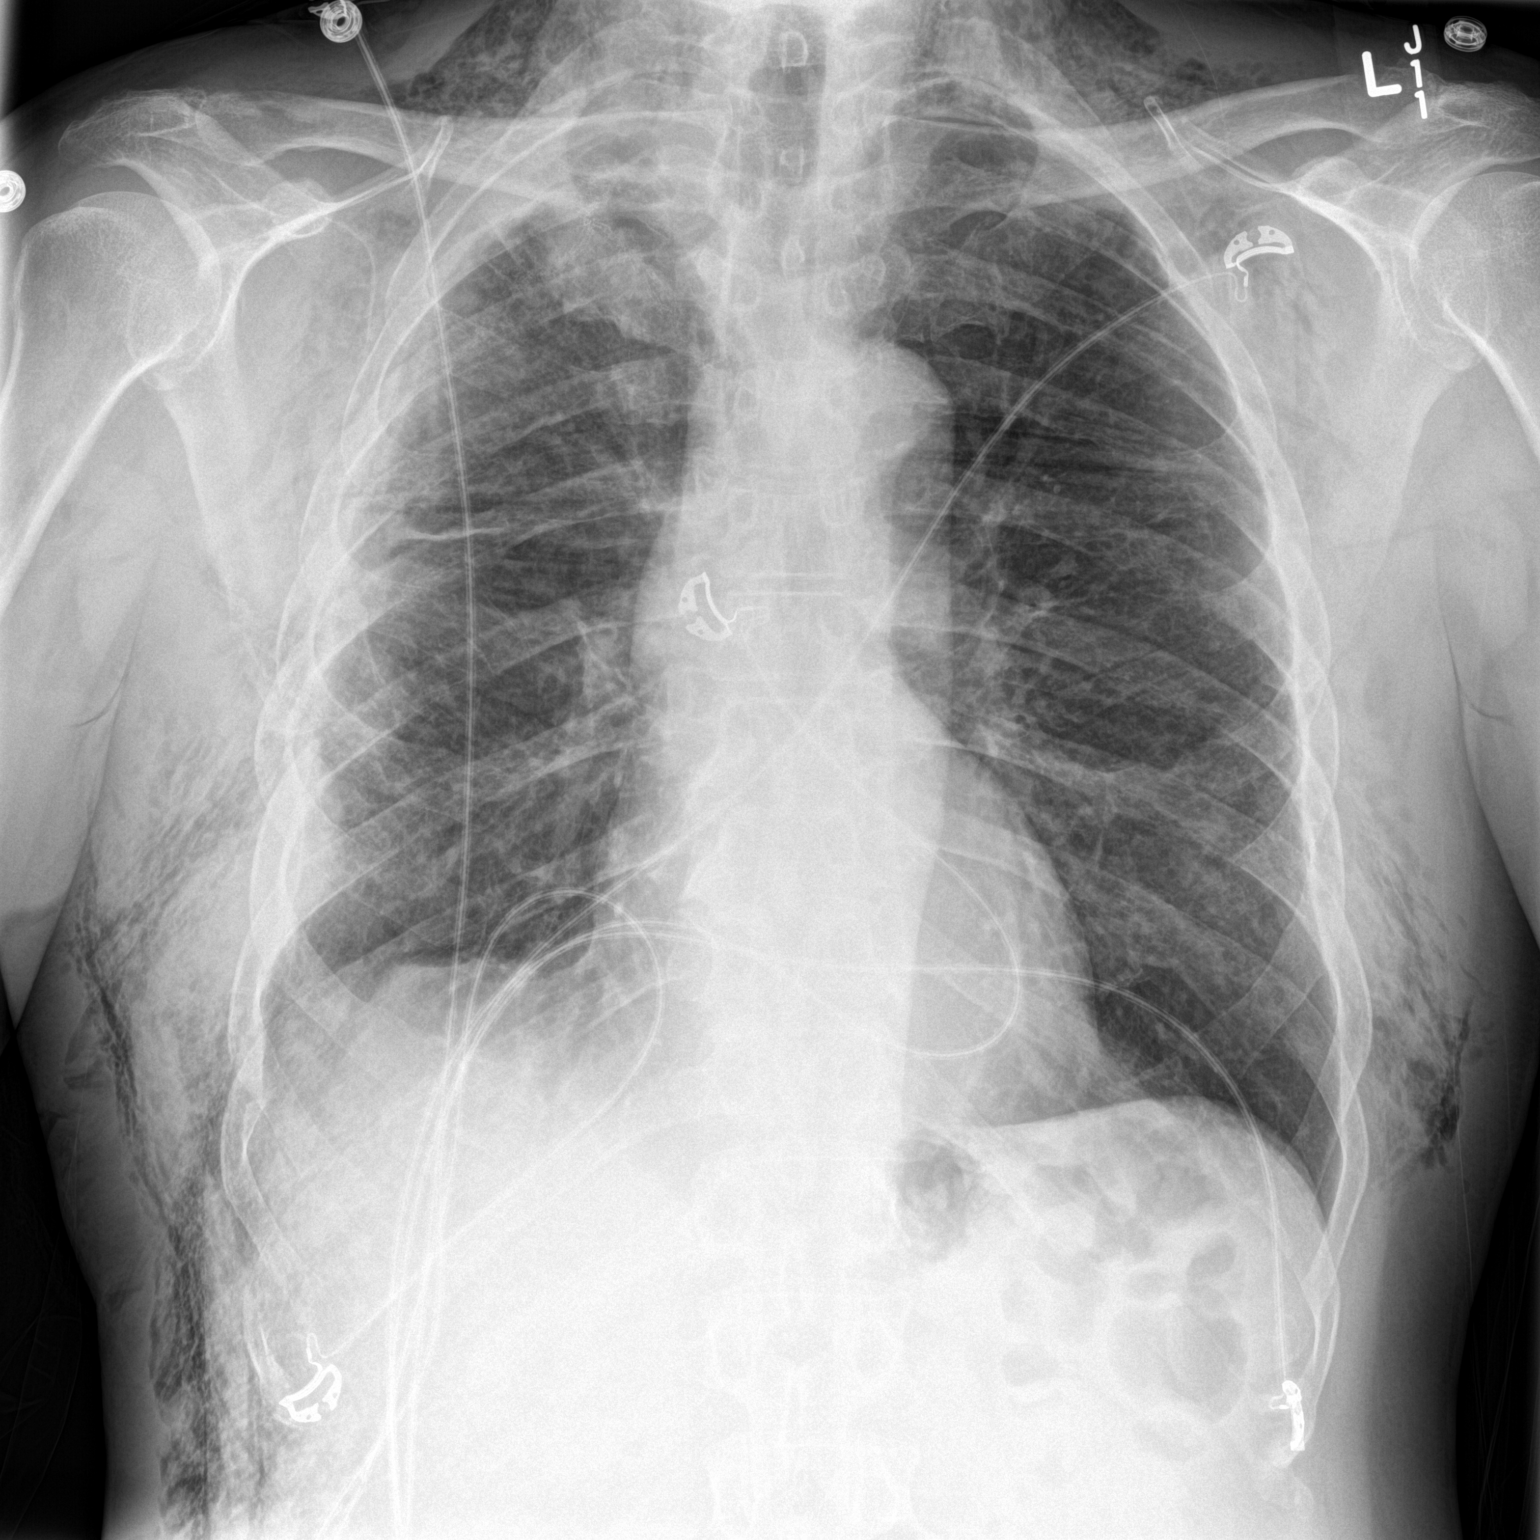

[chest lat]
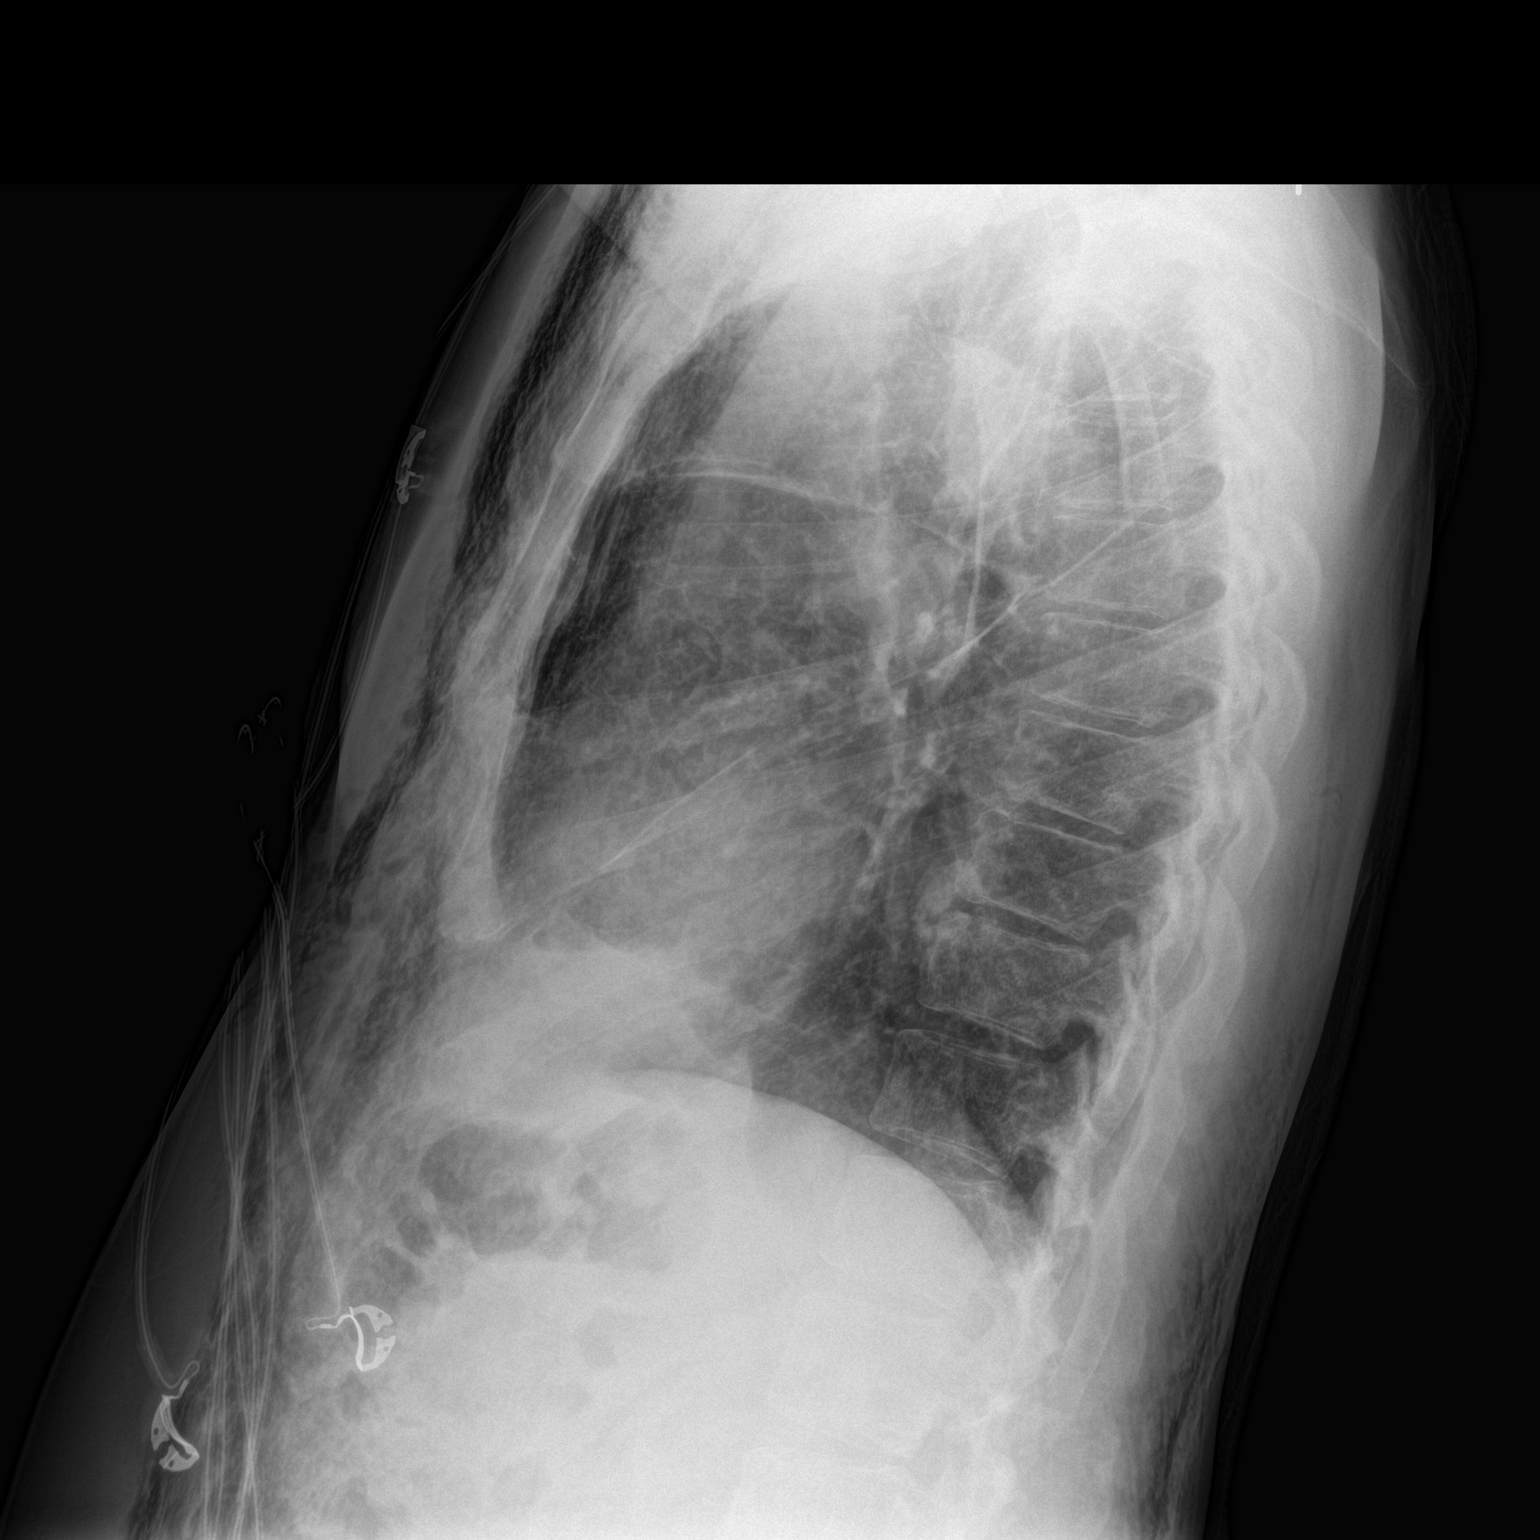

[2 of 2 positions shown; findings below may reference images not displayed]

FINDINGS: Cardiomediastinal silhouette is stable. No acute infiltrate or
pulmonary edema. Stable linear atelectasis or scarring right
midlung. No definite pneumothorax. Extensive subcutaneous emphysema
bilateral chest wall right flank wall and bilateral supraclavicular
region again noted.
IMPRESSION: No acute infiltrate or pulmonary edema. No definite pneumothorax.
Extensive subcutaneous emphysema again noted.

## 2015-05-15 IMAGING — CR DG CHEST 2V
2 series · 2 of 2 positions shown · non-contrast
Comparison: PA and lateral chest x-ray May 30, 2014

CLINICAL DATA: S post spontaneous pneumothorax on May 17, 2014, subsequentVATS on 05/25/2014 for apical bleb resection and
pleurodesis; now having right anterior chest pain

EXAM:
CHEST  2 VIEW

[w chest pa]
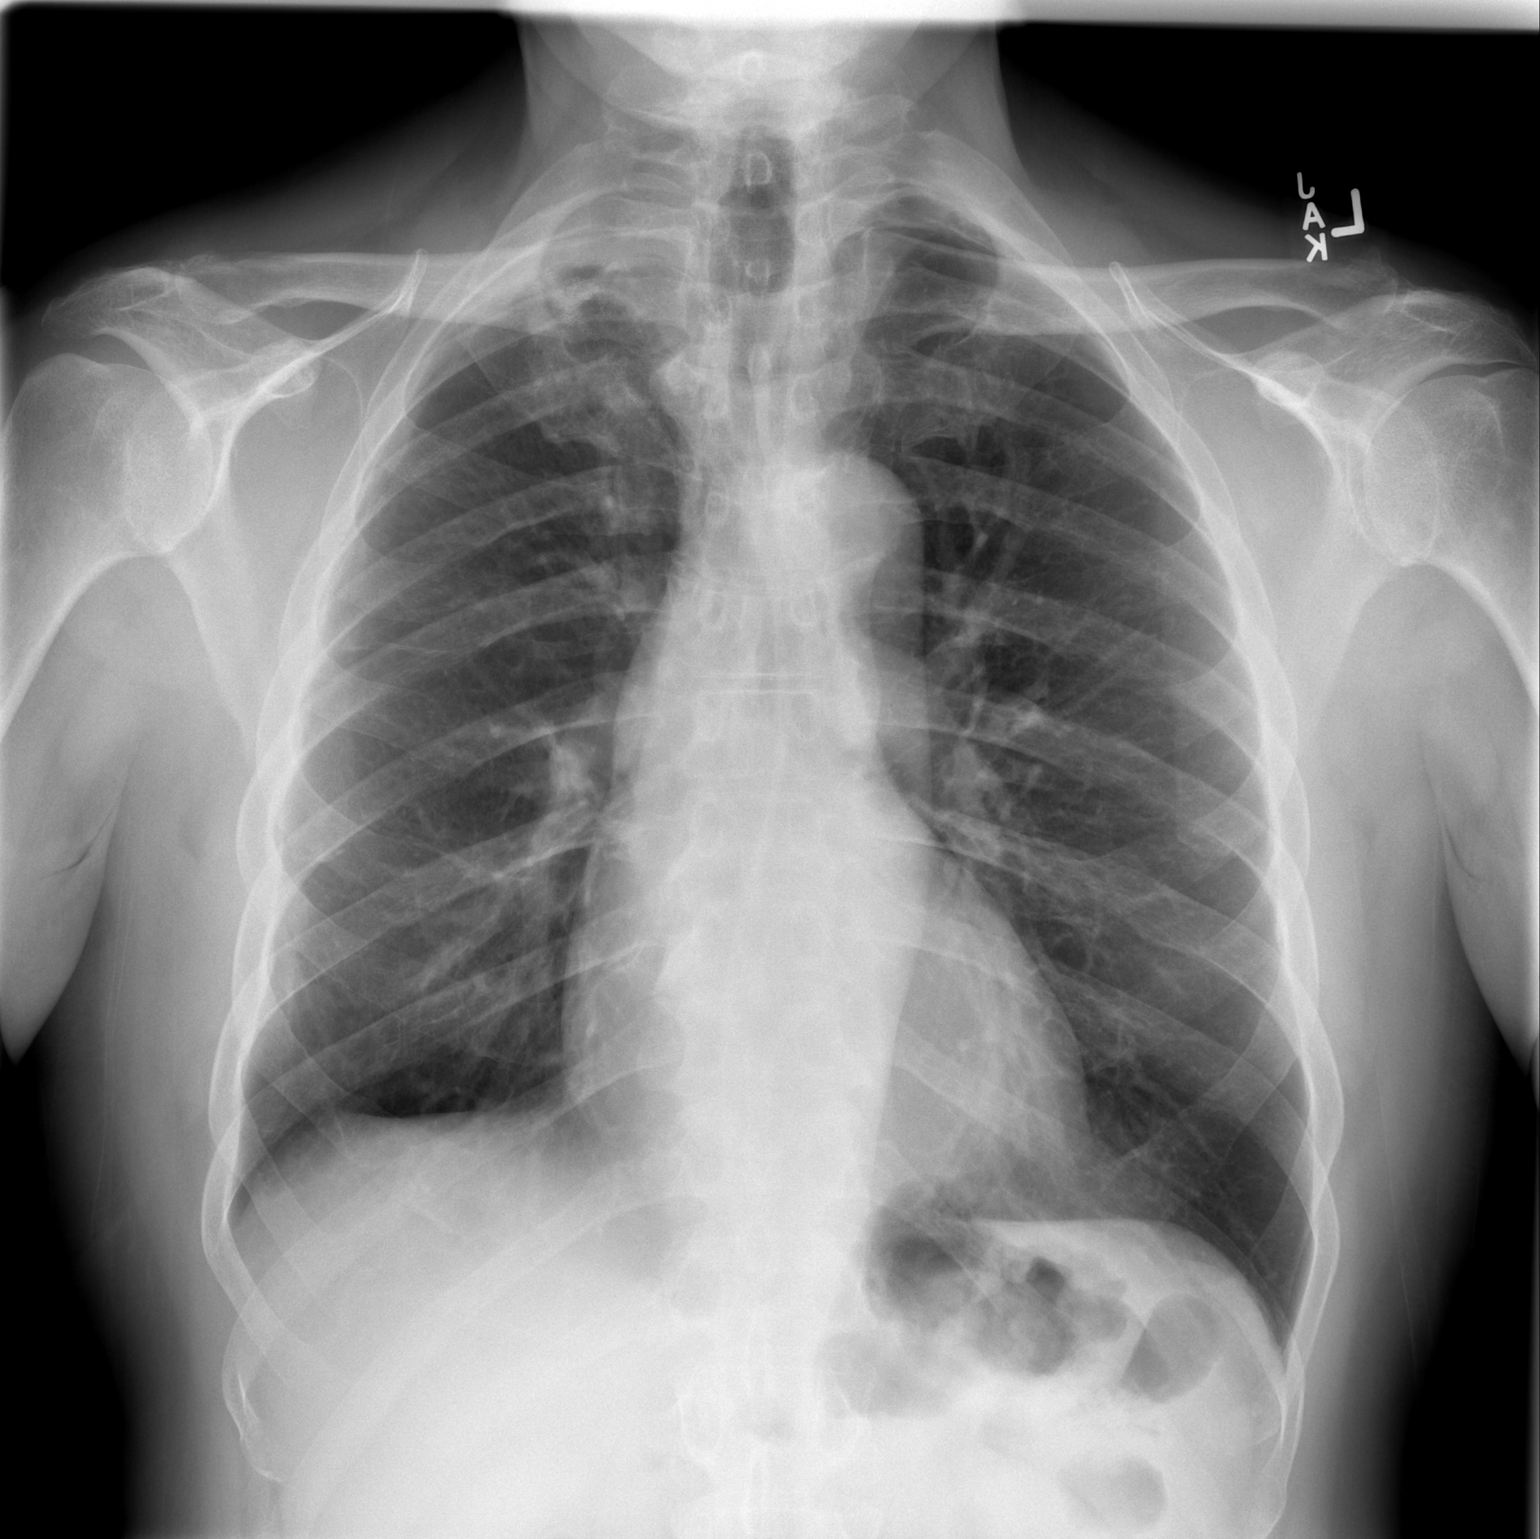

[w chest lat]
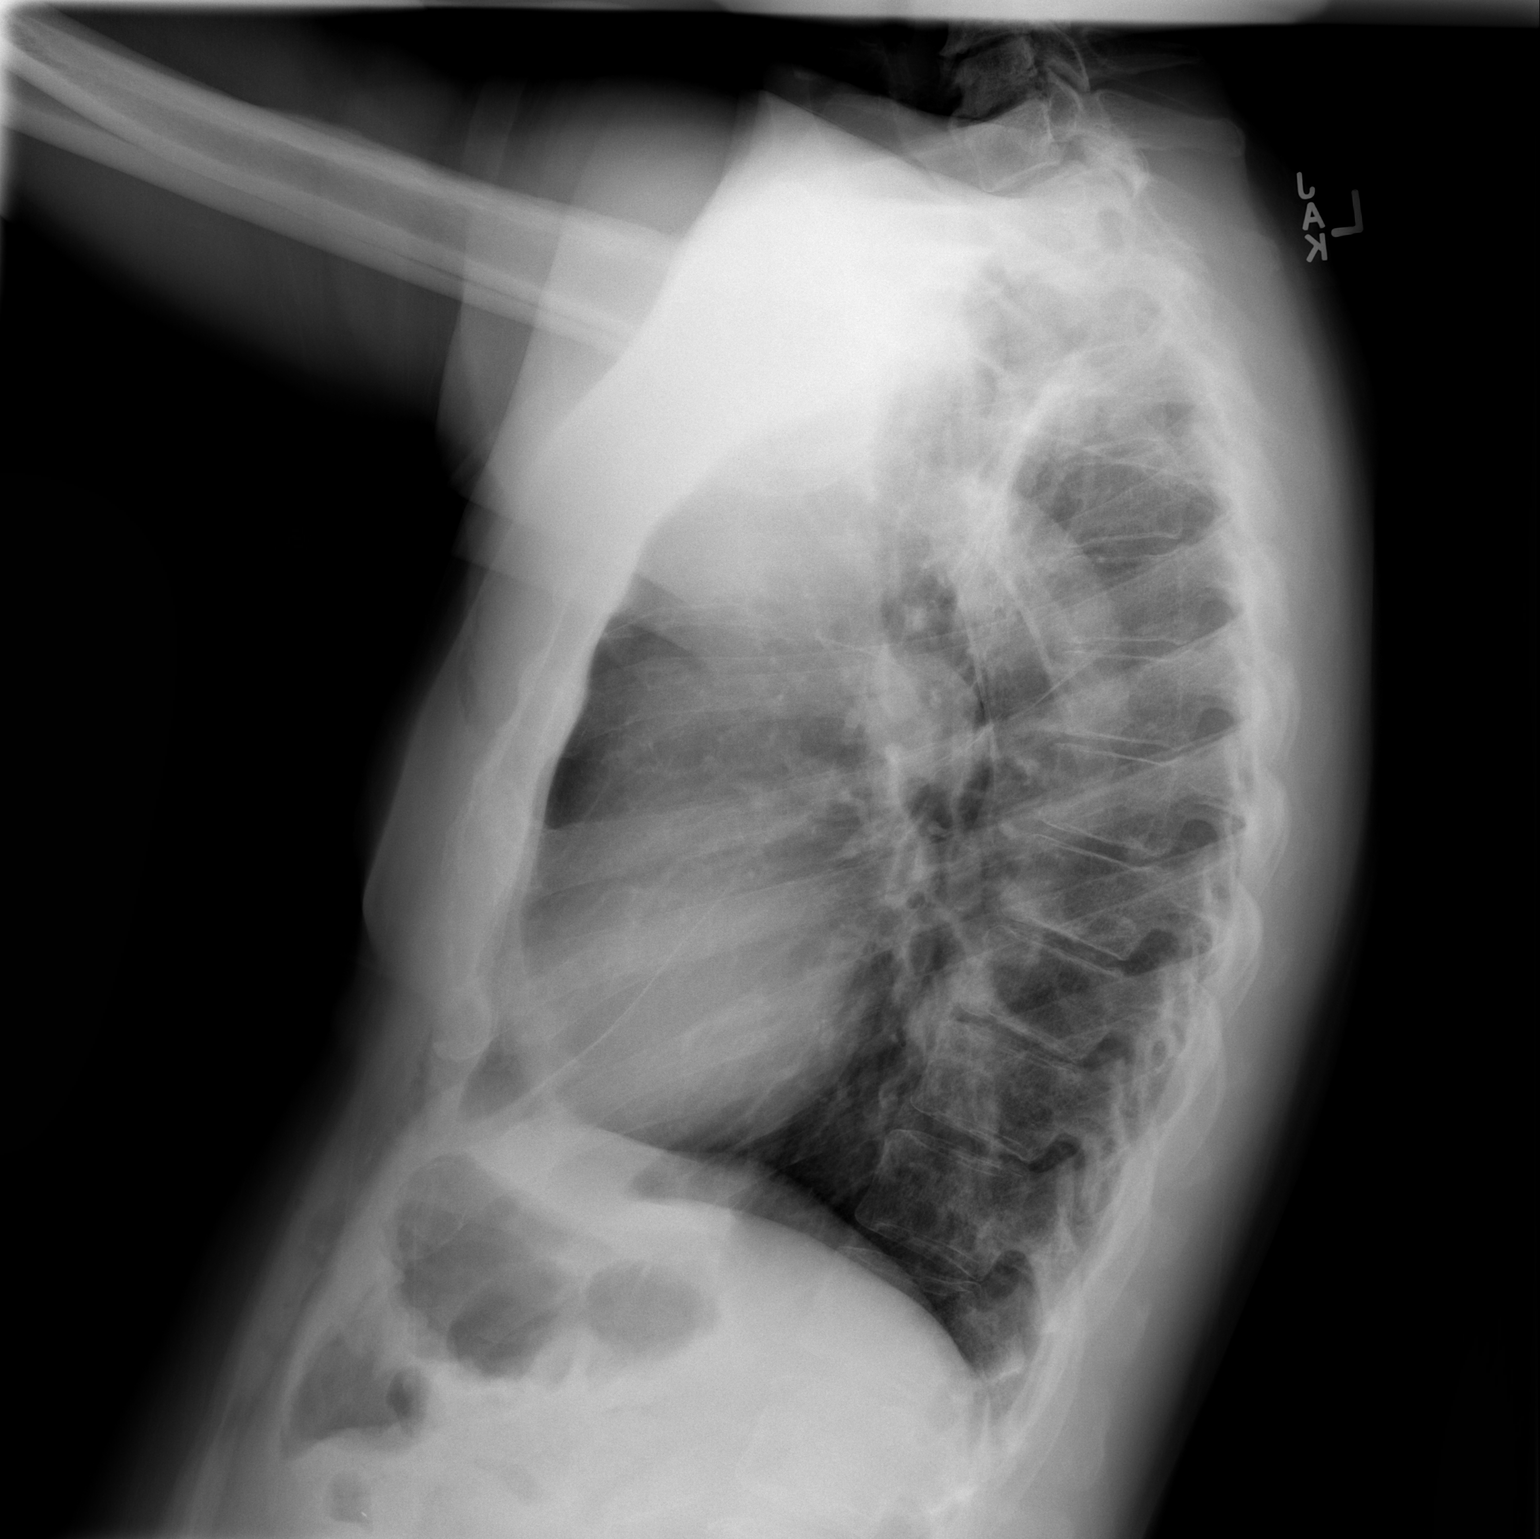

[2 of 2 positions shown; findings below may reference images not displayed]

FINDINGS: The lungs are adequately inflated. There is no pneumothorax. The
extensive subcutaneous air has been resorptive. There is stable
apical pleural thickening on the right. The heart and pulmonary
vascularity are normal. There is no pleural effusion. The bony
thorax is unremarkable.
IMPRESSION: There is no recurrent pneumothorax nor other active cardiopulmonary
disease.
